# Patient Record
Sex: Male | Born: 1935 | Race: White | Hispanic: No | Marital: Married | Smoking: Former smoker
Health system: Southern US, Community
[De-identification: ages and names within clinical notes are randomized; demographics above are authoritative.]

## PROBLEM LIST (undated history)

## (undated) DIAGNOSIS — I2510 Atherosclerotic heart disease of native coronary artery without angina pectoris: Secondary | ICD-10-CM

## (undated) DIAGNOSIS — I4892 Unspecified atrial flutter: Secondary | ICD-10-CM

## (undated) DIAGNOSIS — F1010 Alcohol abuse, uncomplicated: Secondary | ICD-10-CM

## (undated) DIAGNOSIS — E785 Hyperlipidemia, unspecified: Secondary | ICD-10-CM

## (undated) DIAGNOSIS — W3400XA Accidental discharge from unspecified firearms or gun, initial encounter: Secondary | ICD-10-CM

## (undated) DIAGNOSIS — I442 Atrioventricular block, complete: Secondary | ICD-10-CM

## (undated) DIAGNOSIS — I10 Essential (primary) hypertension: Secondary | ICD-10-CM

## (undated) DIAGNOSIS — Z950 Presence of cardiac pacemaker: Secondary | ICD-10-CM

## (undated) DIAGNOSIS — C801 Malignant (primary) neoplasm, unspecified: Secondary | ICD-10-CM

## (undated) HISTORY — DX: Atrioventricular block, complete: I44.2

## (undated) HISTORY — DX: Essential (primary) hypertension: I10

## (undated) HISTORY — DX: Hyperlipidemia, unspecified: E78.5

## (undated) HISTORY — DX: Unspecified atrial flutter: I48.92

## (undated) HISTORY — DX: Atherosclerotic heart disease of native coronary artery without angina pectoris: I25.10

## (undated) HISTORY — DX: Accidental discharge from unspecified firearms or gun, initial encounter: W34.00XA

## (undated) HISTORY — PX: INSERT / REPLACE / REMOVE PACEMAKER: SUR710

## (undated) HISTORY — DX: Presence of cardiac pacemaker: Z95.0

## (undated) HISTORY — DX: Alcohol abuse, uncomplicated: F10.10

---

## 2002-04-04 ENCOUNTER — Emergency Department (HOSPITAL_COMMUNITY): Admission: EM | Admit: 2002-04-04 | Discharge: 2002-04-05 | Payer: Self-pay | Admitting: Emergency Medicine

## 2002-04-05 ENCOUNTER — Encounter: Payer: Self-pay | Admitting: Emergency Medicine

## 2002-04-11 ENCOUNTER — Encounter: Payer: Self-pay | Admitting: Internal Medicine

## 2002-04-11 ENCOUNTER — Ambulatory Visit (HOSPITAL_COMMUNITY): Admission: RE | Admit: 2002-04-11 | Discharge: 2002-04-11 | Payer: Self-pay | Admitting: Internal Medicine

## 2002-04-14 ENCOUNTER — Ambulatory Visit (HOSPITAL_COMMUNITY): Admission: RE | Admit: 2002-04-14 | Discharge: 2002-04-14 | Payer: Self-pay | Admitting: Internal Medicine

## 2002-04-14 ENCOUNTER — Encounter: Payer: Self-pay | Admitting: Internal Medicine

## 2002-04-19 ENCOUNTER — Other Ambulatory Visit: Admission: RE | Admit: 2002-04-19 | Discharge: 2002-04-19 | Payer: Self-pay | Admitting: Internal Medicine

## 2002-04-19 ENCOUNTER — Other Ambulatory Visit: Admission: RE | Admit: 2002-04-19 | Discharge: 2002-04-19 | Payer: Self-pay | Admitting: Otolaryngology

## 2002-04-28 ENCOUNTER — Ambulatory Visit (HOSPITAL_BASED_OUTPATIENT_CLINIC_OR_DEPARTMENT_OTHER): Admission: RE | Admit: 2002-04-28 | Discharge: 2002-04-29 | Payer: Self-pay | Admitting: Otolaryngology

## 2002-04-28 ENCOUNTER — Encounter (INDEPENDENT_AMBULATORY_CARE_PROVIDER_SITE_OTHER): Payer: Self-pay | Admitting: Specialist

## 2002-05-23 ENCOUNTER — Other Ambulatory Visit: Admission: RE | Admit: 2002-05-23 | Discharge: 2002-05-23 | Payer: Self-pay | Admitting: Oncology

## 2002-05-23 ENCOUNTER — Encounter (INDEPENDENT_AMBULATORY_CARE_PROVIDER_SITE_OTHER): Payer: Self-pay

## 2002-07-06 ENCOUNTER — Encounter: Payer: Self-pay | Admitting: Oncology

## 2002-07-06 ENCOUNTER — Ambulatory Visit (HOSPITAL_COMMUNITY): Admission: RE | Admit: 2002-07-06 | Discharge: 2002-07-06 | Payer: Self-pay | Admitting: Oncology

## 2002-07-13 ENCOUNTER — Encounter (HOSPITAL_COMMUNITY): Admission: RE | Admit: 2002-07-13 | Discharge: 2002-10-11 | Payer: Self-pay | Admitting: Oncology

## 2002-09-11 ENCOUNTER — Encounter: Payer: Self-pay | Admitting: Oncology

## 2002-09-11 ENCOUNTER — Ambulatory Visit (HOSPITAL_COMMUNITY): Admission: RE | Admit: 2002-09-11 | Discharge: 2002-09-11 | Payer: Self-pay | Admitting: Oncology

## 2002-10-13 ENCOUNTER — Encounter (HOSPITAL_COMMUNITY): Admission: RE | Admit: 2002-10-13 | Discharge: 2002-10-13 | Payer: Self-pay | Admitting: Oncology

## 2002-12-28 ENCOUNTER — Encounter: Payer: Self-pay | Admitting: Oncology

## 2002-12-28 ENCOUNTER — Ambulatory Visit (HOSPITAL_COMMUNITY): Admission: RE | Admit: 2002-12-28 | Discharge: 2002-12-28 | Payer: Self-pay | Admitting: Oncology

## 2004-04-22 ENCOUNTER — Inpatient Hospital Stay (HOSPITAL_COMMUNITY): Admission: AD | Admit: 2004-04-22 | Discharge: 2004-04-23 | Payer: Self-pay | Admitting: Internal Medicine

## 2004-05-09 ENCOUNTER — Ambulatory Visit (HOSPITAL_COMMUNITY): Admission: RE | Admit: 2004-05-09 | Discharge: 2004-05-09 | Payer: Self-pay | Admitting: Internal Medicine

## 2004-05-09 HISTORY — PX: COLONOSCOPY: SHX174

## 2004-09-02 ENCOUNTER — Inpatient Hospital Stay (HOSPITAL_COMMUNITY): Admission: AD | Admit: 2004-09-02 | Discharge: 2004-09-04 | Payer: Self-pay | Admitting: Internal Medicine

## 2004-11-24 ENCOUNTER — Ambulatory Visit: Payer: Self-pay | Admitting: Oncology

## 2005-06-11 ENCOUNTER — Ambulatory Visit: Payer: Self-pay | Admitting: Oncology

## 2005-08-06 ENCOUNTER — Ambulatory Visit: Payer: Self-pay | Admitting: Oncology

## 2005-08-26 ENCOUNTER — Inpatient Hospital Stay (HOSPITAL_COMMUNITY): Admission: AD | Admit: 2005-08-26 | Discharge: 2005-08-28 | Payer: Self-pay | Admitting: Internal Medicine

## 2005-09-11 ENCOUNTER — Inpatient Hospital Stay (HOSPITAL_COMMUNITY): Admission: RE | Admit: 2005-09-11 | Discharge: 2005-09-13 | Payer: Self-pay | Admitting: Psychiatry

## 2005-09-11 ENCOUNTER — Ambulatory Visit: Payer: Self-pay | Admitting: Psychiatry

## 2005-09-11 ENCOUNTER — Encounter: Payer: Self-pay | Admitting: Psychiatry

## 2005-09-22 ENCOUNTER — Ambulatory Visit (HOSPITAL_COMMUNITY): Admission: RE | Admit: 2005-09-22 | Discharge: 2005-09-22 | Payer: Self-pay | Admitting: Internal Medicine

## 2005-12-11 ENCOUNTER — Ambulatory Visit: Payer: Self-pay | Admitting: Oncology

## 2006-06-07 ENCOUNTER — Ambulatory Visit: Payer: Self-pay | Admitting: Oncology

## 2006-06-09 LAB — CBC WITH DIFFERENTIAL/PLATELET
BASO%: 0.4 % (ref 0.0–2.0)
EOS%: 3.4 % (ref 0.0–7.0)
HGB: 15.1 g/dL (ref 13.0–17.1)
MCH: 35.4 pg — ABNORMAL HIGH (ref 28.0–33.4)
MCHC: 35 g/dL (ref 32.0–35.9)
MCV: 101.3 fL — ABNORMAL HIGH (ref 81.6–98.0)
MONO%: 10 % (ref 0.0–13.0)
RBC: 4.28 10*6/uL (ref 4.20–5.71)
RDW: 14.7 % — ABNORMAL HIGH (ref 11.2–14.6)
lymph#: 1.3 10*3/uL (ref 0.9–3.3)

## 2006-07-21 ENCOUNTER — Inpatient Hospital Stay (HOSPITAL_COMMUNITY): Admission: AD | Admit: 2006-07-21 | Discharge: 2006-07-23 | Payer: Self-pay | Admitting: Cardiovascular Disease

## 2006-07-21 ENCOUNTER — Ambulatory Visit: Payer: Self-pay | Admitting: Cardiology

## 2006-07-21 ENCOUNTER — Encounter: Payer: Self-pay | Admitting: Cardiology

## 2006-08-11 ENCOUNTER — Ambulatory Visit: Payer: Self-pay

## 2006-08-30 ENCOUNTER — Ambulatory Visit: Payer: Self-pay

## 2006-09-03 ENCOUNTER — Ambulatory Visit: Payer: Self-pay | Admitting: Cardiology

## 2006-09-13 ENCOUNTER — Encounter: Payer: Self-pay | Admitting: Cardiology

## 2006-09-13 ENCOUNTER — Ambulatory Visit: Payer: Self-pay

## 2006-10-01 ENCOUNTER — Ambulatory Visit: Payer: Self-pay | Admitting: Cardiology

## 2006-11-17 ENCOUNTER — Ambulatory Visit: Payer: Self-pay | Admitting: Cardiology

## 2006-11-24 ENCOUNTER — Inpatient Hospital Stay (HOSPITAL_COMMUNITY): Admission: AD | Admit: 2006-11-24 | Discharge: 2006-11-26 | Payer: Self-pay | Admitting: Internal Medicine

## 2006-12-06 ENCOUNTER — Ambulatory Visit: Payer: Self-pay | Admitting: Oncology

## 2006-12-07 ENCOUNTER — Ambulatory Visit (HOSPITAL_COMMUNITY): Payer: Self-pay | Admitting: Psychology

## 2006-12-16 LAB — RETICULOCYTES
IRF: 0.52 — ABNORMAL HIGH (ref 0.070–0.380)
RETIC #: 112.1 10*3/uL — ABNORMAL HIGH (ref 31.8–103.9)
Retic %: 2.9 % — ABNORMAL HIGH (ref 0.7–2.3)

## 2006-12-16 LAB — CBC WITH DIFFERENTIAL/PLATELET
HCT: 39.4 % (ref 38.7–49.9)
MCHC: 34 g/dL (ref 32.0–35.9)
MONO#: 0.5 10*3/uL (ref 0.1–0.9)
NEUT%: 65.8 % (ref 40.0–75.0)
RBC: 3.86 10*6/uL — ABNORMAL LOW (ref 4.20–5.71)
RDW: 14.9 % — ABNORMAL HIGH (ref 11.2–14.6)
WBC: 4.8 10*3/uL (ref 4.0–10.0)
lymph#: 1 10*3/uL (ref 0.9–3.3)

## 2007-01-18 ENCOUNTER — Ambulatory Visit (HOSPITAL_COMMUNITY): Payer: Self-pay | Admitting: Psychology

## 2007-01-30 ENCOUNTER — Ambulatory Visit: Payer: Self-pay | Admitting: *Deleted

## 2007-01-30 ENCOUNTER — Inpatient Hospital Stay (HOSPITAL_COMMUNITY): Admission: AD | Admit: 2007-01-30 | Discharge: 2007-01-31 | Payer: Self-pay | Admitting: *Deleted

## 2007-03-15 ENCOUNTER — Ambulatory Visit: Payer: Self-pay | Admitting: Oncology

## 2007-03-17 LAB — CBC WITH DIFFERENTIAL/PLATELET
Basophils Absolute: 0.1 10*3/uL (ref 0.0–0.1)
Eosinophils Absolute: 0.3 10*3/uL (ref 0.0–0.5)
HGB: 14.3 g/dL (ref 13.0–17.1)
MCV: 100.9 fL — ABNORMAL HIGH (ref 81.6–98.0)
MONO%: 8.7 % (ref 0.0–13.0)
NEUT#: 3 10*3/uL (ref 1.5–6.5)
RBC: 4.02 10*6/uL — ABNORMAL LOW (ref 4.20–5.71)
RDW: 14.8 % — ABNORMAL HIGH (ref 11.2–14.6)
WBC: 5.1 10*3/uL (ref 4.0–10.0)
lymph#: 1.2 10*3/uL (ref 0.9–3.3)

## 2007-03-18 LAB — COMPREHENSIVE METABOLIC PANEL
AST: 32 U/L (ref 0–37)
Albumin: 4.2 g/dL (ref 3.5–5.2)
Alkaline Phosphatase: 63 U/L (ref 39–117)
BUN: 11 mg/dL (ref 6–23)
Calcium: 8.7 mg/dL (ref 8.4–10.5)
Chloride: 104 mEq/L (ref 96–112)
Glucose, Bld: 111 mg/dL — ABNORMAL HIGH (ref 70–99)
Potassium: 4.1 mEq/L (ref 3.5–5.3)
Sodium: 138 mEq/L (ref 135–145)
Total Protein: 6.7 g/dL (ref 6.0–8.3)

## 2007-03-18 LAB — DIRECT ANTIGLOBULIN TEST (NOT AT ARMC)
DAT (Complement): NEGATIVE
DAT IgG: NEGATIVE

## 2007-03-22 ENCOUNTER — Ambulatory Visit (HOSPITAL_COMMUNITY): Payer: Self-pay | Admitting: Psychology

## 2007-05-16 IMAGING — CR DG CHEST 2V
2 series · 2 of 2 positions shown · non-contrast
Comparison: none

CLINICAL DATA: Atrial flutter.  Alcohol abuse. 
 2-VIEW CHEST:

[view not recorded (1 of 2)]
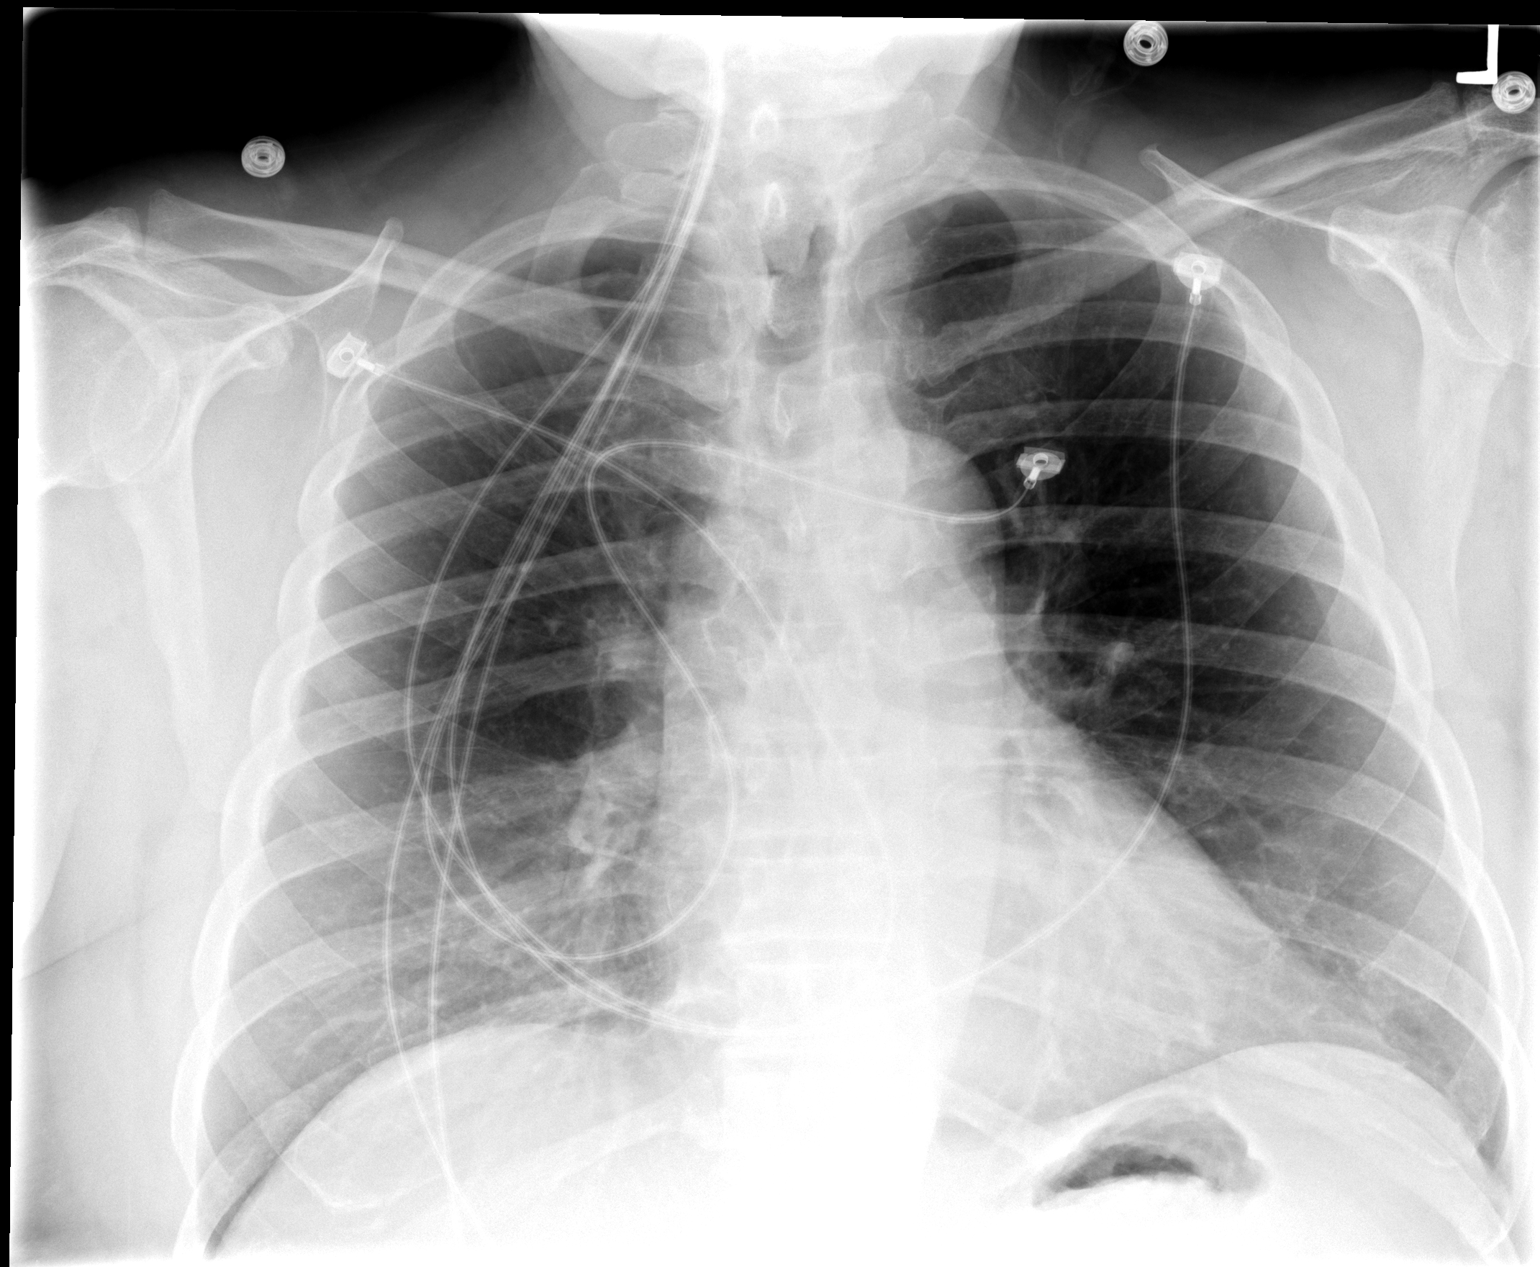

[view not recorded (2 of 2)]
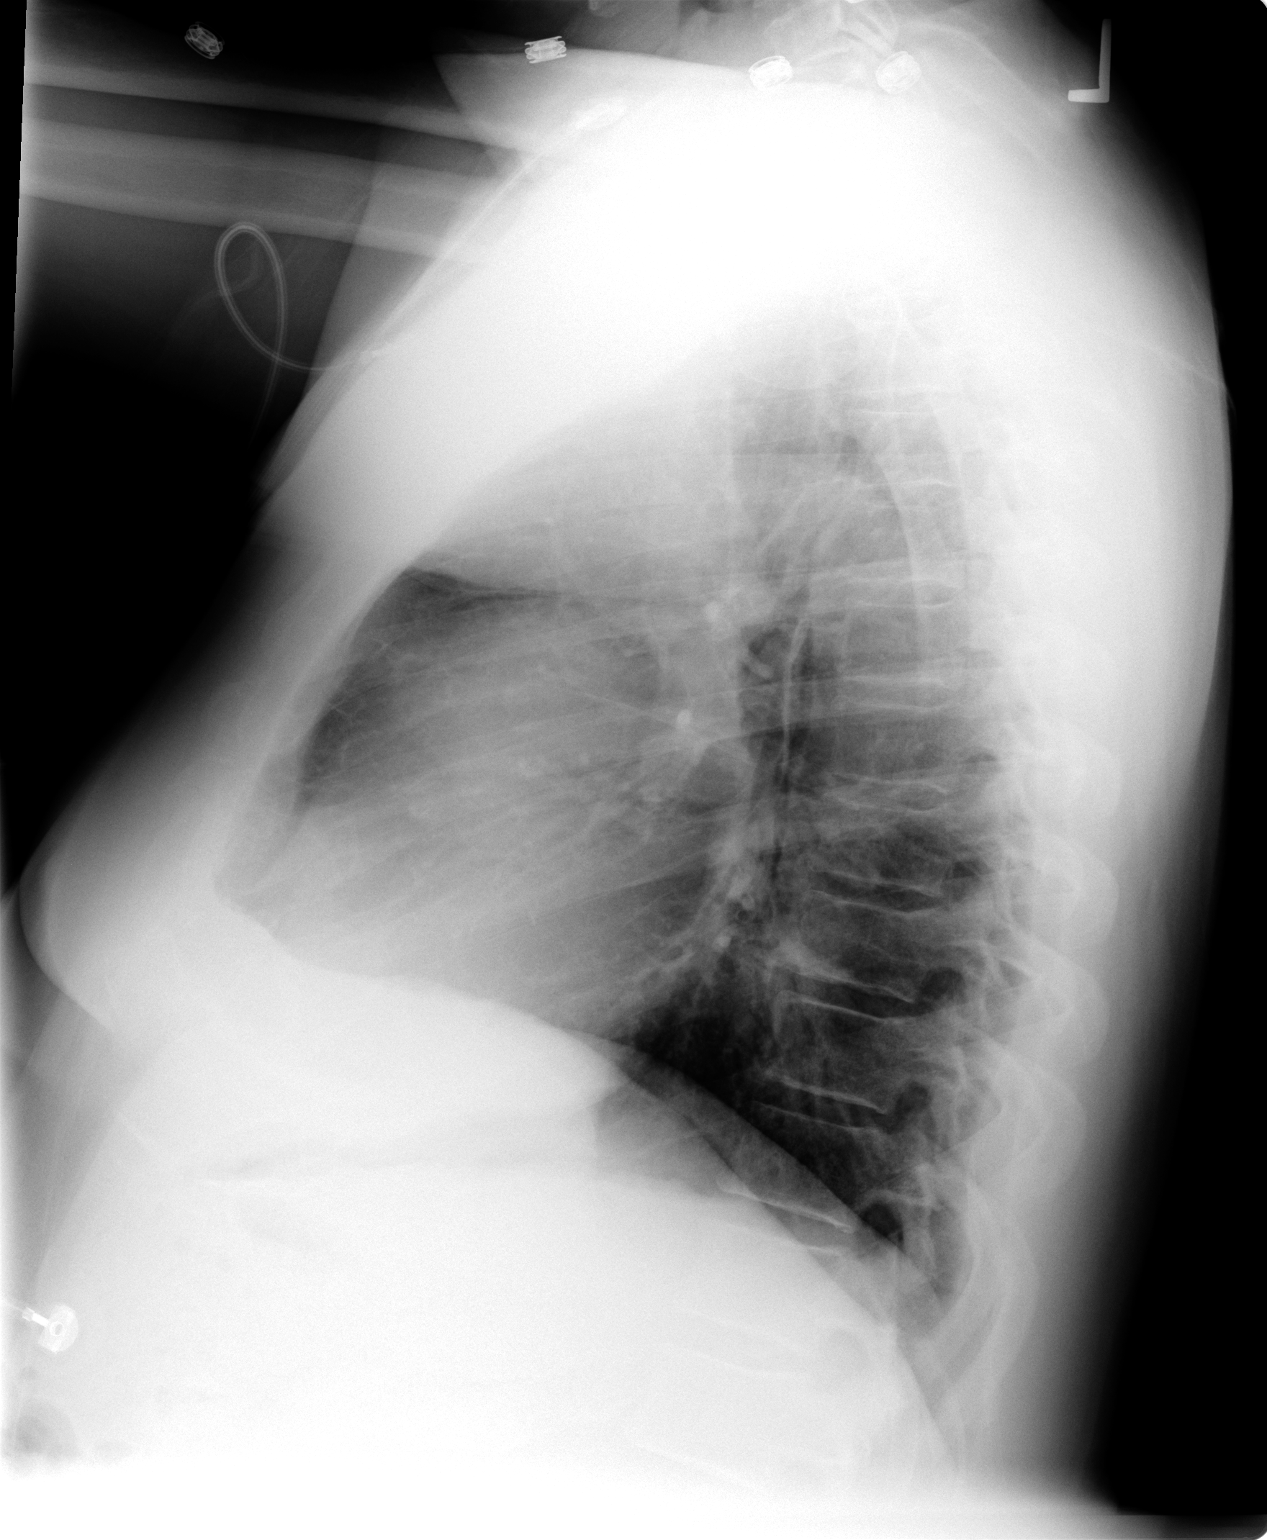

[2 of 2 positions shown; findings below may reference images not displayed]

FINDINGS: PA and lateral views of the chest are made and are compared to a previous CT scan of the chest dated 04/22/04 and show the heart to be within the limits of normal.  The aorta is minimally elongated but not dilated or calcified.  There is no edema, pleural effusion, or pneumothorax.  Bronchovascular markings are mildly accentuated in a generalized fashion.  Bony thorax appears normal.
IMPRESSION: No evidence of active disease within the chest.  Mild diffuse peribronchial thickening.  Mild aortic elongation without dilatation or calcification.

## 2007-06-13 ENCOUNTER — Ambulatory Visit: Payer: Self-pay | Admitting: Oncology

## 2007-07-07 LAB — CBC WITH DIFFERENTIAL/PLATELET
BASO%: 0.4 % (ref 0.0–2.0)
Eosinophils Absolute: 0.2 10*3/uL (ref 0.0–0.5)
LYMPH%: 23.1 % (ref 14.0–48.0)
MCHC: 35.7 g/dL (ref 32.0–35.9)
MONO#: 0.5 10*3/uL (ref 0.1–0.9)
NEUT#: 2.7 10*3/uL (ref 1.5–6.5)
Platelets: 157 10*3/uL (ref 145–400)
RBC: 3.68 10*6/uL — ABNORMAL LOW (ref 4.20–5.71)
RDW: 13.8 % (ref 11.2–14.6)
WBC: 4.4 10*3/uL (ref 4.0–10.0)
lymph#: 1 10*3/uL (ref 0.9–3.3)

## 2007-07-08 LAB — COMPREHENSIVE METABOLIC PANEL
ALT: 16 U/L (ref 0–53)
Albumin: 4.3 g/dL (ref 3.5–5.2)
CO2: 27 mEq/L (ref 19–32)
Potassium: 4 mEq/L (ref 3.5–5.3)
Sodium: 143 mEq/L (ref 135–145)
Total Bilirubin: 0.6 mg/dL (ref 0.3–1.2)
Total Protein: 6.6 g/dL (ref 6.0–8.3)

## 2007-07-08 LAB — LACTATE DEHYDROGENASE: LDH: 144 U/L (ref 94–250)

## 2007-07-19 ENCOUNTER — Ambulatory Visit: Payer: Self-pay | Admitting: Cardiology

## 2007-09-29 ENCOUNTER — Ambulatory Visit: Payer: Self-pay | Admitting: Cardiology

## 2007-12-23 ENCOUNTER — Ambulatory Visit (HOSPITAL_COMMUNITY): Admission: RE | Admit: 2007-12-23 | Discharge: 2007-12-23 | Payer: Self-pay | Admitting: Internal Medicine

## 2007-12-28 ENCOUNTER — Ambulatory Visit: Payer: Self-pay | Admitting: Cardiology

## 2008-01-04 ENCOUNTER — Ambulatory Visit: Payer: Self-pay | Admitting: Oncology

## 2008-01-06 LAB — CBC WITH DIFFERENTIAL/PLATELET
Basophils Absolute: 0 10*3/uL (ref 0.0–0.1)
EOS%: 1.4 % (ref 0.0–7.0)
Eosinophils Absolute: 0.1 10*3/uL (ref 0.0–0.5)
HGB: 13.1 g/dL (ref 13.0–17.1)
LYMPH%: 16.5 % (ref 14.0–48.0)
MCH: 34.9 pg — ABNORMAL HIGH (ref 28.0–33.4)
MCV: 100.1 fL — ABNORMAL HIGH (ref 81.6–98.0)
MONO%: 12.8 % (ref 0.0–13.0)
NEUT#: 3.5 10*3/uL (ref 1.5–6.5)
Platelets: 211 10*3/uL (ref 145–400)
RBC: 3.76 10*6/uL — ABNORMAL LOW (ref 4.20–5.71)

## 2008-01-30 ENCOUNTER — Ambulatory Visit (HOSPITAL_COMMUNITY): Admission: RE | Admit: 2008-01-30 | Discharge: 2008-01-30 | Payer: Self-pay | Admitting: Internal Medicine

## 2008-03-28 ENCOUNTER — Ambulatory Visit: Payer: Self-pay | Admitting: Cardiology

## 2008-06-20 ENCOUNTER — Ambulatory Visit: Payer: Self-pay | Admitting: Cardiology

## 2008-07-09 ENCOUNTER — Ambulatory Visit: Payer: Self-pay | Admitting: Oncology

## 2008-07-12 LAB — CBC WITH DIFFERENTIAL/PLATELET
EOS%: 2.1 % (ref 0.0–7.0)
Eosinophils Absolute: 0.2 10*3/uL (ref 0.0–0.5)
LYMPH%: 12.8 % — ABNORMAL LOW (ref 14.0–48.0)
MONO%: 9.2 % (ref 0.0–13.0)
NEUT#: 6.6 10*3/uL — ABNORMAL HIGH (ref 1.5–6.5)
NEUT%: 75.5 % — ABNORMAL HIGH (ref 40.0–75.0)
RBC: 4.18 10*6/uL — ABNORMAL LOW (ref 4.20–5.71)
WBC: 8.7 10*3/uL (ref 4.0–10.0)
lymph#: 1.1 10*3/uL (ref 0.9–3.3)

## 2008-09-12 ENCOUNTER — Ambulatory Visit: Payer: Self-pay | Admitting: Cardiology

## 2008-12-19 ENCOUNTER — Ambulatory Visit: Payer: Self-pay | Admitting: Cardiology

## 2009-04-01 ENCOUNTER — Ambulatory Visit: Payer: Self-pay | Admitting: Cardiology

## 2009-04-08 ENCOUNTER — Encounter (INDEPENDENT_AMBULATORY_CARE_PROVIDER_SITE_OTHER): Payer: Self-pay | Admitting: *Deleted

## 2009-06-23 DIAGNOSIS — I442 Atrioventricular block, complete: Secondary | ICD-10-CM

## 2009-06-23 DIAGNOSIS — E785 Hyperlipidemia, unspecified: Secondary | ICD-10-CM

## 2009-06-23 DIAGNOSIS — I2510 Atherosclerotic heart disease of native coronary artery without angina pectoris: Secondary | ICD-10-CM

## 2009-06-23 DIAGNOSIS — I4892 Unspecified atrial flutter: Secondary | ICD-10-CM

## 2009-06-23 DIAGNOSIS — I10 Essential (primary) hypertension: Secondary | ICD-10-CM

## 2009-06-25 ENCOUNTER — Ambulatory Visit: Payer: Self-pay | Admitting: Cardiology

## 2009-07-11 ENCOUNTER — Ambulatory Visit: Payer: Self-pay | Admitting: Oncology

## 2009-07-15 ENCOUNTER — Encounter: Payer: Self-pay | Admitting: Cardiology

## 2009-07-15 LAB — CBC WITH DIFFERENTIAL/PLATELET
BASO%: 0.3 % (ref 0.0–2.0)
EOS%: 3.4 % (ref 0.0–7.0)
HCT: 38.2 % — ABNORMAL LOW (ref 38.4–49.9)
HGB: 13.5 g/dL (ref 13.0–17.1)
MCH: 34.5 pg — ABNORMAL HIGH (ref 27.2–33.4)
MCHC: 35.3 g/dL (ref 32.0–36.0)
MCV: 97.7 fL (ref 79.3–98.0)
MONO%: 7.9 % (ref 0.0–14.0)
RDW: 13.7 % (ref 11.0–14.6)
WBC: 6.1 10*3/uL (ref 4.0–10.3)

## 2009-10-02 ENCOUNTER — Ambulatory Visit: Payer: Self-pay | Admitting: Internal Medicine

## 2009-10-10 ENCOUNTER — Encounter: Payer: Self-pay | Admitting: Cardiology

## 2009-11-01 ENCOUNTER — Ambulatory Visit: Payer: Self-pay | Admitting: Oncology

## 2009-11-05 LAB — CBC WITH DIFFERENTIAL/PLATELET
BASO%: 0.4 % (ref 0.0–2.0)
EOS%: 2.7 % (ref 0.0–7.0)
HGB: 13.3 g/dL (ref 13.0–17.1)
MCH: 35.6 pg — ABNORMAL HIGH (ref 27.2–33.4)
MCV: 102.3 fL — ABNORMAL HIGH (ref 79.3–98.0)
RBC: 3.74 10*6/uL — ABNORMAL LOW (ref 4.20–5.82)
WBC: 5.6 10*3/uL (ref 4.0–10.3)
lymph#: 1 10*3/uL (ref 0.9–3.3)

## 2010-01-01 ENCOUNTER — Ambulatory Visit: Payer: Self-pay | Admitting: Cardiology

## 2010-01-29 ENCOUNTER — Encounter: Payer: Self-pay | Admitting: Cardiology

## 2010-04-02 ENCOUNTER — Ambulatory Visit: Payer: Self-pay | Admitting: Cardiology

## 2010-04-09 ENCOUNTER — Encounter: Payer: Self-pay | Admitting: Cardiology

## 2010-04-09 ENCOUNTER — Telehealth: Payer: Self-pay | Admitting: Cardiology

## 2010-04-21 ENCOUNTER — Ambulatory Visit: Payer: Self-pay | Admitting: Cardiology

## 2010-04-21 DIAGNOSIS — R29818 Other symptoms and signs involving the nervous system: Secondary | ICD-10-CM | POA: Insufficient documentation

## 2010-04-21 DIAGNOSIS — R4789 Other speech disturbances: Secondary | ICD-10-CM | POA: Insufficient documentation

## 2010-04-21 DIAGNOSIS — G459 Transient cerebral ischemic attack, unspecified: Secondary | ICD-10-CM | POA: Insufficient documentation

## 2010-05-29 ENCOUNTER — Encounter: Admission: RE | Admit: 2010-05-29 | Discharge: 2010-05-29 | Payer: Self-pay | Admitting: Neurology

## 2010-07-04 ENCOUNTER — Ambulatory Visit: Payer: Self-pay | Admitting: Oncology

## 2010-07-08 ENCOUNTER — Encounter: Payer: Self-pay | Admitting: Cardiology

## 2010-07-10 ENCOUNTER — Ambulatory Visit: Payer: Self-pay | Admitting: Cardiology

## 2010-08-28 ENCOUNTER — Ambulatory Visit: Payer: Self-pay | Admitting: Oncology

## 2010-09-01 ENCOUNTER — Encounter: Payer: Self-pay | Admitting: Cardiology

## 2010-09-01 LAB — CBC WITH DIFFERENTIAL/PLATELET
BASO%: 0 % (ref 0.0–2.0)
Basophils Absolute: 0 10e3/uL (ref 0.0–0.1)
EOS%: 1.5 % (ref 0.0–7.0)
Eosinophils Absolute: 0.1 10e3/uL (ref 0.0–0.5)
HCT: 37.3 % — ABNORMAL LOW (ref 38.4–49.9)
HGB: 12.8 g/dL — ABNORMAL LOW (ref 13.0–17.1)
LYMPH%: 13.5 % — ABNORMAL LOW (ref 14.0–49.0)
MCH: 35.1 pg — ABNORMAL HIGH (ref 27.2–33.4)
MCHC: 34.2 g/dL (ref 32.0–36.0)
MCV: 102.4 fL — ABNORMAL HIGH (ref 79.3–98.0)
MONO#: 0.5 10e3/uL (ref 0.1–0.9)
MONO%: 7.2 % (ref 0.0–14.0)
NEUT#: 5.3 10e3/uL (ref 1.5–6.5)
NEUT%: 77.8 % — ABNORMAL HIGH (ref 39.0–75.0)
Platelets: 139 10e3/uL — ABNORMAL LOW (ref 140–400)
RBC: 3.64 10e6/uL — ABNORMAL LOW (ref 4.20–5.82)
RDW: 14.7 % — ABNORMAL HIGH (ref 11.0–14.6)
WBC: 6.8 10e3/uL (ref 4.0–10.3)
lymph#: 0.9 10e3/uL (ref 0.9–3.3)

## 2010-10-13 ENCOUNTER — Ambulatory Visit: Payer: Self-pay | Admitting: Internal Medicine

## 2010-10-13 ENCOUNTER — Ambulatory Visit: Payer: Self-pay | Admitting: Cardiology

## 2010-10-15 ENCOUNTER — Telehealth: Payer: Self-pay | Admitting: Internal Medicine

## 2010-10-16 ENCOUNTER — Inpatient Hospital Stay (HOSPITAL_COMMUNITY): Admission: RE | Admit: 2010-10-16 | Discharge: 2010-10-17 | Payer: Self-pay | Admitting: Internal Medicine

## 2010-10-16 ENCOUNTER — Ambulatory Visit: Payer: Self-pay | Admitting: Internal Medicine

## 2010-10-17 ENCOUNTER — Encounter (INDEPENDENT_AMBULATORY_CARE_PROVIDER_SITE_OTHER): Payer: Self-pay | Admitting: *Deleted

## 2010-10-21 ENCOUNTER — Ambulatory Visit: Payer: Self-pay | Admitting: Internal Medicine

## 2010-10-23 ENCOUNTER — Encounter: Payer: Self-pay | Admitting: Internal Medicine

## 2010-10-30 ENCOUNTER — Ambulatory Visit: Payer: Self-pay

## 2010-10-30 ENCOUNTER — Encounter: Payer: Self-pay | Admitting: Internal Medicine

## 2011-01-16 ENCOUNTER — Encounter: Payer: Self-pay | Admitting: Internal Medicine

## 2011-01-16 ENCOUNTER — Ambulatory Visit
Admission: RE | Admit: 2011-01-16 | Discharge: 2011-01-16 | Payer: Self-pay | Source: Home / Self Care | Attending: Internal Medicine | Admitting: Internal Medicine

## 2011-01-20 NOTE — Letter (Signed)
Summary: [facility]   Imported By: Marylou Mccoy 10/06/2010 07:49:41  _____________________________________________________________________  External Attachment:    Type:   Image     Comment:   External Document

## 2011-01-20 NOTE — Miscellaneous (Signed)
Summary: Device change out  Clinical Lists Changes  Observations: Added new observation of PPMLEADSTAT3: active (10/23/2010 16:30) Added new observation of PPMLEADSER3: [id number] (10/23/2010 16:30) Added new observation of PPMLEADMOD3: 5076  (10/23/2010 16:30) Added new observation of PPMLEADDOI3: 10/16/2010  (10/23/2010 16:30) Added new observation of PPMLEADLOC3: RV  (10/23/2010 16:30) Added new observation of PPMLEADSTAT2: capped  (10/23/2010 16:30) Added new observation of PPM DOI: 10/16/2010  (10/23/2010 16:30) Added new observation of PPM SERL#: [id number]  (10/23/2010 16:30) Added new observation of PPM MODL#: ADDRL1  (10/23/2010 40:34) Added new observation of MAGNET RTE: BOL 85 ERI 65  (10/23/2010 16:30) Added new observation of PPMEXPLCOMM: 10/16/10 ADDRO1/[id number] H EXPLANTED  (10/23/2010 16:30) Added new observation of PPM IMP MD: Hillis Range, MD  (10/23/2010 16:30) Added new observation of PACEMAKER MD: Hillis Range, MD  (10/23/2010 16:30)      PPM Specifications Following MD:  Hillis Range, MD     PPM Vendor:  Medtronic     PPM Model Number:  ADDRL1     PPM Serial Number:  [id number] PPM DOI:  10/16/2010     PPM Implanting MD:  Hillis Range, MD  Lead 1    Location: RA     DOI: 07/22/2006     Model #: 7564     Serial #: [id number]     Status: active Lead 2    Location: RV     DOI: 07/22/2006     Model #: 6606     Serial #: [id number]     Status: capped Lead 3    Location: RV     DOI: 10/16/2010     Model #: 3557     Serial #: [id number]     Status: active  Magnet Response Rate:  BOL 85 ERI 65  Indications:  Completle heart block with syncope  Explantation Comments:  10/16/10 ADDRO1/PWB205481 H EXPLANTED  PPM Follow Up Pacer Dependent:  Yes      Episodes Coumadin:  No  Parameters Mode:  DDDR     Lower Rate Limit:  60     Upper Rate Limit:  130 Paced AV Delay:  200     Sensed AV Delay:  200

## 2011-01-20 NOTE — Progress Notes (Signed)
Summary: abnormal pacer transmission  Phone Note Outgoing Call Call back at [facility] Phone [phone]   Call placed by: Gypsy Balsam RN BSN,  April 09, 2010 10:47 AM Summary of Call: Dr Juanda Chance reviewed [facility] tranmsission.  Ventricular lead impedence and autocapture threshold with uptick in last month.  Appt with Dr Juanda Chance 04-21-10 to evaluate in office.  Pt to send another [facility] tranmsission today. Gypsy Balsam RN BSN  April 09, 2010 10:47 AM

## 2011-01-20 NOTE — Cardiovascular Report (Signed)
Summary: Office Visit   Office Visit   Imported By: Roderic Ovens 05/09/2010 16:28:01  _____________________________________________________________________  External Attachment:    Type:   Image     Comment:   External Document

## 2011-01-20 NOTE — Procedures (Signed)
Summary: wound check.mdt.amber   Current Medications (verified): 1)  Simvastatin 40 Mg Tabs (Simvastatin) .... Take One Tablet By Mouth Daily At Bedtime 2)  Lisinopril 10 Mg Tabs (Lisinopril) .... Take One Tablet By Mouth Daily 3)  Aspirin 81 Mg Tbec (Aspirin) .... Take One Tablet By Mouth Daily 4)  Omeprazole 20 Mg Cpdr (Omeprazole) .... 3 X A Week 5)  Hydrocordone .... Shoulder Pain 6)  Multivitamins   Tabs (Multiple Vitamin) .[name].. 1 Tab By Mouth Once Daily 7)  Clonazepam 1 Mg Tabs (Clonazepam) .... As Directed  Allergies (verified): No Known Drug Allergies  PPM Specifications Following MD:  Hillis Range, MD     PPM Vendor:  Medtronic     PPM Model Number:  ADDRL1     PPM Serial Number:  [id number] H PPM DOI:  10/16/2010     PPM Implanting MD:  Hillis Range, MD  Lead 1    Location: RA     DOI: 07/22/2006     Model #: 4098     Serial #: [id number]     Status: active Lead 2    Location: RV     DOI: 07/22/2006     Model #: 6213     Serial #: [id number]     Status: capped Lead 3    Location: RV     DOI: 10/16/2010     Model #: 2952     Serial #: [id number]     Status: active  Magnet Response Rate:  BOL 85 ERI 65  Indications:  Completle heart block with syncope  Explantation Comments:  10/16/10 ADDRO1/PWB205481 H EXPLANTED  PPM Follow Up Remote Check?  No Battery Voltage:  2.79 V     Battery Est. Longevity:  10 years     Pacer Dependent:  Yes       PPM Device Measurements Atrium  Amplitude: 4.0 mV, Impedance: 474 ohms, Threshold: 0.5 V at 0.4 msec Right Ventricle  Impedance: 652 ohms, Threshold: 0.75 V at 0.4 msec  Episodes MS Episodes:  1     Percent Mode Switch:  <0.1%     Coumadin:  No Ventricular High Rate:  0     Atrial Pacing:  72.7%     Ventricular Pacing:  100%  Parameters Mode:  DDDR     Lower Rate Limit:  60     Upper Rate Limit:  130 Paced AV Delay:  200     Sensed AV Delay:  200 Next Cardiology Appt Due:  01/16/2011 Tech Comments:  Steri strips removed, no redness  or edema noted.  No parameter changes.  Device function normal.  1 mode switch .  ROV 01/16/11 with Dr. Johney Frame. Altha Harm, LPN  October 30, 2010 3:53 PM

## 2011-01-20 NOTE — Cardiovascular Report (Signed)
Summary: Office Visit   Office Visit   Imported By: Roderic Ovens 11/10/2010 15:46:41  _____________________________________________________________________  External Attachment:    Type:   Image     Comment:   External Document

## 2011-01-20 NOTE — Cardiovascular Report (Signed)
Summary: Office Visit   Office Visit   Imported By: Roderic Ovens 10/14/2010 10:47:28  _____________________________________________________________________  External Attachment:    Type:   Image     Comment:   External Document

## 2011-01-20 NOTE — Progress Notes (Signed)
Summary: pt rtn call to set up procedure   Phone Note Call from Patient Call back at Home Phone [phone]   Caller: Spouse Reason for Call: Talk to Nurse, Talk to Doctor Summary of Call: pt spouse rtn your call to set up procedure for tomorrow Initial call taken by: Omer Jack,  October 15, 2010 4:07 PM  Follow-up for Phone Call        Pt will be there tomorrow at 8:00 am for RV lead revision Pt moved per Dr Johney Frame Dennis Bast, RN, BSN  October 15, 2010 4:36 PM

## 2011-01-20 NOTE — Letter (Signed)
Summary: Remote Device Check  [facility], Main Office  [address]   [place], [place] [address]   Phone: [phone]  Fax: [phone]     January 29, 2010 MRN: [id number]   ION GONNELLA [address] [place], [place]  [address]   Dear Mr. Callanan,   Your remote transmission was recieved and reviewed by your physician.  All diagnostics were within normal limits for you.  __X___Your next transmission is scheduled for:  April 02, 2010.  Please transmit at any time this day.  If you have a wireless device your transmission will be sent automatically.     Sincerely,  Proofreader

## 2011-01-20 NOTE — Assessment & Plan Note (Signed)
Summary: f67m   Primary Zenya Hickam:  dr Ouida Sills  CC:  check up.  History of Present Illness: [name] is 76 years old and return for management of his pacemaker. He had a Medtronic DDD pacemaker implanted in August of 2007 for AV block. On his last visit here he had a high threshold on the ventricular lead with a high impedance. We switched him to unipolar and pressure was a little better. We brought him back today for reevaluation.  He also has heavy coronary calcification but has had a negative Myoview. He also has a history of atrial flutter related to alcohol consumption and has a history of alcohol consumption.  His other problems include hypertension, hyperlipidemia, and positive cigarette use.  Current Medications (verified): 1)  Simvastatin 40 Mg Tabs (Simvastatin) .... Take One Tablet By Mouth Daily At Bedtime 2)  Lisinopril 10 Mg Tabs (Lisinopril) .... Take One Tablet By Mouth Daily 3)  Aspirin 81 Mg Tbec (Aspirin) .... Take One Tablet By Mouth Daily 4)  Omeprazole 20 Mg Cpdr (Omeprazole) .... 3 X A Week 5)  Hydrocordone .... Shoulder Pain 6)  Multivitamins   Tabs (Multiple Vitamin) .[name].. 1 Tab By Mouth Once Daily 7)  Clonazepam 1 Mg Tabs (Clonazepam) .... As Directed  Allergies: No Known Drug Allergies  Past History:  Past Medical History: Reviewed history from 06/23/2009 and no changes required. 1. 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 2. Hypertension, borderline hypertension, currently. 3. Heavy coronary calcification with nonischemic Myoview scan. 4. Hyperlipidemia. 5. History of atrial flutter with alcohol consumption. 6. History of alcohol use in the past. 7. Current cigarette use.   Review of Systems       ROS is negative except as outlined in HPI.   Vital Signs:  Patient profile:   76 year old male Height:      68 inches Weight:      189 pounds BMI:     28.84 Pulse rate:   80 /  minute Resp:     14 per minute BP sitting:   136 / 70  (left arm)  Vitals Entered By: Kem Parkinson (October 13, 2010 9:07 AM)  Physical Exam  Additional Exam:  Gen. Well-nourished, in no distress   Neck: No JVD, thyroid not enlarged, no carotid bruits Lungs: No tachypnea, clear without rales, rhonchi or wheezes Cardiovascular: Rhythm regular, PMI not displaced,  heart sounds  normal, no murmurs or gallops, no peripheral edema, pulses normal in all 4 extremities. Abdomen: BS normal, abdomen soft and non-tender without masses or organomegaly, no hepatosplenomegaly. MS: No deformities, no cyanosis or clubbing   Neuro:  No focal sns   Skin:  no lesions    PPM Specifications Following MD:  Everardo Beals. Juanda Chance, MD     PPM Vendor:  Medtronic     PPM Model Number:  ADDR01     PPM Serial Number:  [id number] PPM DOI:  07/22/2006      Lead 1    Location: RA     DOI: 07/22/2006     Model #: 4098     Serial #: [id number]     Status: active Lead 2    Location: RV     DOI: 07/22/2006     Model #: 6213     Serial #: [id number]     Status: active   Indications:  Completle heart block with syncope   PPM Follow Up  Pacer Dependent:  Yes      Episodes Coumadin:  No  Parameters Mode:  DDDR     Lower Rate Limit:  60     Upper Rate Limit:  130 Paced AV Delay:  200     Sensed AV Delay:  200  Impression & Recommendations:  Problem # 1:  PACEMAKER (ICD-V45.[name]01) He has a high threshold on the ventricular lead is totally pacer dependent. His impedances gradually progressive. I'm not sure if this is a problem at the lead myocardial interface or if this might be a fracture of the clavicle. I've asked Dr. already to see him with plans to place a new ventricular lead and change his generator.  Problem # 2:  HYPERTENSION, BENIGN (ICD-401.1) This is well-controlled on current medications. His updated medication list for this problem includes:    Lisinopril 10 Mg Tabs (Lisinopril) .[name]... Take one tablet by  mouth daily    Aspirin 81 Mg Tbec (Aspirin) .[name]... Take one tablet by mouth daily  Patient Instructions: 1)  Refer to Dr Johney Frame for lead revison and generator change.

## 2011-01-20 NOTE — Miscellaneous (Signed)
Summary: dx correction  Clinical Lists Changes  Problems: Changed problem from PACEMAKER (ICD-V45..01) to PACEMAKER, PERMANENT (ICD-V45.01)  changed the incorrect dx code to correct dx code 

## 2011-01-20 NOTE — Cardiovascular Report (Signed)
Summary: Office Visit Remote   Office Visit Remote   Imported By: Roderic Ovens 01/31/2010 16:14:16  _____________________________________________________________________  External Attachment:    Type:   Image     Comment:   External Document

## 2011-01-20 NOTE — Cardiovascular Report (Signed)
Summary: Office Visit Remote  Office Visit Remote   Imported By: Roderic Ovens 04/09/2010 14:51:34  _____________________________________________________________________  External Attachment:    Type:   Image     Comment:   External Document

## 2011-01-20 NOTE — Assessment & Plan Note (Signed)
Summary: [name]   Primary Provider:  dr Ouida Sills   History of Present Illness: The patient is 76 years old and returns for management of his pacemaker he had a Medtronic DDD pacemaker implanted for complete AV block in 2007. At that time his implant he was known to have heavy coronary calcification and he underwent evaluation with a Myoview scan which was negative for ischemia. He also has a history of atrial flutter which was associated with alcohol consumption.  He has done well with his heart with no symptoms of chest pain or palpitations. He says he may have some decreased exercise tolerance last few months. He was brought in early because his telephone check showed a increase in impedance and his ventricular lead and an increase in his auto threshold.  He has a history of previous alcohol consumption cigarette use but has stopped these. He is now back to raising cattle.  he also has episodes when he is sleeping in a chair where he gets paralysis of both his upper and lower extremities and also has some garbled speech. If he sits up this resolved. He has had 3 episodes in the last 3-4 weeks. He has not had symptoms suggestive of obstructive sleep apnea in that he has not had any snoring or any apneic spells witnessed by his wife. His wife is with him today.  Allergies: No Known Drug Allergies  Past History:  Past Medical History: Reviewed history from 06/23/2009 and no changes required. 1. 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 2. Hypertension, borderline hypertension, currently. 3. Heavy coronary calcification with nonischemic Myoview scan. 4. Hyperlipidemia. 5. History of atrial flutter with alcohol consumption. 6. History of alcohol use in the past. 7. Current cigarette use.   Review of Systems       ROS is negative except as outlined in HPI.   Vital Signs:  Patient profile:   76 year old male Height:      68  inches Weight:      194 pounds BMI:     29.60 Pulse rate:   61 / minute BP sitting:   109 / 59  (left arm) Cuff size:   regular  Vitals Entered By: Burnett Kanaris, CNA (Apr 21, 2010 1:52 PM)  Physical Exam  Additional Exam:  Gen. Well-nourished, in no distress   Neck: No JVD, thyroid not enlarged, no carotid bruits Lungs: No tachypnea, clear without rales, rhonchi or wheezes Cardiovascular: Rhythm regular, PMI not displaced,  heart sounds  normal, no murmurs or gallops, no peripheral edema, pulses normal in all 4 extremities. Abdomen: BS normal, abdomen soft and non-tender without masses or organomegaly, no hepatosplenomegaly. MS: No deformities, no cyanosis or clubbing   Neuro:  No focal sns   Skin:  no lesions    PPM Specifications Following MD:  Everardo Beals. Juanda Chance, MD     PPM Vendor:  Medtronic     PPM Model Number:  ADDR01     PPM Serial Number:  [id number] PPM DOI:  07/22/2006      Lead 1    Location: RA     DOI: 07/22/2006     Model #: 4098     Serial #: [id number]     Status: active Lead 2    Location: RV     DOI: 07/22/2006     Model #: 6213     Serial #: [id number]  Status: active   Indications:  Completle heart block with syncope   PPM Follow Up Pacer Dependent:  Yes      Episodes Coumadin:  No  Parameters Mode:  DDDR     Lower Rate Limit:  60     Upper Rate Limit:  130 Paced AV Delay:  200     Sensed AV Delay:  200  Impression & Recommendations:  Problem # 1:  PACEMAKER (ICD-V45.[name]01) We will interrogate his pacemaker today and evaluate his elevated impedance and threshold on the ventricular lead.  We interrogated his pacemaker today. Over the past few months his ventricular lead impedance is increased from about 420-757. His ventricular threshold has risen to 2.25. In the unipolar mode his impedance was slightly less and his threshold was 1.5 and we left him in the unipolar mode. We will check him every 3 months and if there continues to be a trend upward will  see him back sooner. It is possible he may need a new lead. I plan to see him in 6 months. Princella Ion saw him with me today.  he is programmed on a threshold and the maximum output from his generator is 7.5 V.  Problem # 2:  CAD, NATIVE VESSEL (ICD-414.01)  Because of heavy coronary calcification we are treating him as secondary risk factor modification. He has had no chest pain. His updated medication list for this problem includes:    Lisinopril 10 Mg Tabs (Lisinopril) .[name]... Take one tablet by mouth daily    Aspirin 81 Mg Tbec (Aspirin) .[name]... Take one tablet by mouth daily  Orders: EKG w/ Interpretation (93000)  Problem # 3:  TRANSIENT ISCHEMIC ATTACK (ICD-435.9) He has had unusual spells when he is sleeping in his recliner. He developed paralysis of his upper and lower extremities and garbled speech. This resolves when he completely sits up. It does not sound like sleep apnea. We will plan to get a neurological consultation for these transient neurological deficits.  Other Orders: Neurology Referral (Neuro)  Patient Instructions: 1)  You are being referred to Neurology. 2)  Your physician wants you to follow-up in: 6 months.  You will receive a reminder letter in the mail two months in advance. If you don't receive a letter, please call our office to schedule the follow-up appointment.

## 2011-01-20 NOTE — Assessment & Plan Note (Signed)
Summary: 1 yr f/u   Visit Type:  Follow-up Primary Provider:  dr Ouida Sills  CC:  no complaints.  History of Present Illness: The patient is 76 years old and returns for management of his pacemaker he had a Medtronic DDD pacemaker implanted for complete AV block in 2007. At that time his implant he was known to have heavy coronary calcification and he underwent evaluation with a Myoview scan which was negative for ischemia. He also has a history of atrial flutter which was associated with alcohol consumption.  When I saw him last time he had a high impedance on the ventricular lead and his threshold had increased. We switched him to unipolar mode. His pressure was 1.5 at that time and is impedance was about 700 at that time on the unipolar mode. He has been patient dependent.  He also had noted some paralysis in his lower extremities after waking up from sleep. He was seen by neurology and had a CT scan of his head which didn't show any major abnormality and it was decided not to pursue further workup at this time.  Current Medications (verified): 1)  Simvastatin 40 Mg Tabs (Simvastatin) .... Take One Tablet By Mouth Daily At Bedtime 2)  Lisinopril 10 Mg Tabs (Lisinopril) .... Take One Tablet By Mouth Daily 3)  Aspirin 81 Mg Tbec (Aspirin) .... Take One Tablet By Mouth Daily 4)  Omeprazole 20 Mg Cpdr (Omeprazole) .... 3 X A Week 5)  Hydrocordone .... Shoulder Pain 6)  Multi Vit .... Daily 7)  Clonazepam 1 Mg Tabs (Clonazepam) .... As Directed  Allergies (verified): No Known Drug Allergies  Past History:  Past Medical History: Reviewed history from 06/23/2009 and no changes required. 1. 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 2. Hypertension, borderline hypertension, currently. 3. Heavy coronary calcification with nonischemic Myoview scan. 4. Hyperlipidemia. 5. History of atrial flutter with alcohol consumption. 6.  History of alcohol use in the past. 7. Current cigarette use.   Review of Systems       ROS is negative except as outlined in HPI.   Vital Signs:  Patient profile:   76 year old male Height:      68 inches Weight:      190 pounds Pulse rate:   86 / minute BP sitting:   120 / 70  (left arm) Cuff size:   regular  Vitals Entered By: Burnett Kanaris, CNA (July 10, 2010 3:44 PM)  Physical Exam  Additional Exam:  Gen. Well-nourished, in no distress   Neck: No JVD, thyroid not enlarged, no carotid bruits Lungs: No tachypnea, clear without rales, rhonchi or wheezes Cardiovascular: Rhythm regular, PMI not displaced,  heart sounds  normal, no murmurs or gallops, no peripheral edema, pulses normal in all 4 extremities. Abdomen: BS normal, abdomen soft and non-tender without masses or organomegaly, no hepatosplenomegaly. MS: No deformities, no cyanosis or clubbing   Neuro:  No focal sns   Skin:  no lesions    PPM Specifications Following MD:  Everardo Beals. Juanda Chance, MD     PPM Vendor:  Medtronic     PPM Model Number:  ADDR01     PPM Serial Number:  [id number] PPM DOI:  07/22/2006      Lead 1    Location: RA     DOI: 07/22/2006     Model #: 5638     Serial #: [id number]  Status: active Lead 2    Location: RV     DOI: 07/22/2006     Model #: 1610     Serial #: [id number]     Status: active   Indications:  Completle heart block with syncope   PPM Follow Up Remote Check?  No Battery Voltage:  2.74 V     Battery Est. Longevity:  2 years     Pacer Dependent:  Yes       PPM Device Measurements [facility]  Amplitude: 2.8 mV, Impedance: 459 ohms, Threshold: 1.0 V at 0.4 msec Right Ventricle  Impedance: 892 ohms, Threshold: 1.5 V at 1.0 msec  Episodes Coumadin:  No  Parameters Mode:  DDDR     Lower Rate Limit:  60     Upper Rate Limit:  130 Paced AV Delay:  200     Sensed AV Delay:  200 Tech Comments:  Ventricular impedance slowing increasing.   Capture threshold stable.  ROV 3 months. Altha Harm, LPN  July 10, 2010 4:18 PM   Impression & Recommendations:  Problem # 1:  PACEMAKER (ICD-V45.[name]01) He has a DDD pacemaker implanted in 2007 for AV block. He has a high impedance on the ventricular lead and we have reprogrammed him to unipolar mode. His impedance has gone up slightly since his last visit but his pressure was unchanged. We will continue to follow this closely and we'll see him back in 3 months. He is totally pacer dependent.  Problem # 2:  HYPERTENSION, BENIGN (ICD-401.1) He had been on lisinopril for hypertension but he got dizzy and his blood pressures measured at home were on the low side so he stopped this. His blood pressure is 120/80 today. We will leave him off the lisinopril for now. He will follow up with his primary care physician. His updated medication list for this problem includes:    Lisinopril 10 Mg Tabs (Lisinopril) .[name]... Take one tablet by mouth daily    Aspirin 81 Mg Tbec (Aspirin) .[name]... Take one tablet by mouth daily  Problem # 3:  CAD, NATIVE VESSEL (ICD-414.01)  He has very heavy coronary calcification so we are treating him with secondary prevention he is on aspirin and simvastatin. His updated medication list for this problem includes:    Lisinopril 10 Mg Tabs (Lisinopril) .[name]... Take one tablet by mouth daily    Aspirin 81 Mg Tbec (Aspirin) .[name]... Take one tablet by mouth daily  Orders: EKG w/ Interpretation (93000)  Patient Instructions: 1)  Your physician recommends that you schedule a follow-up appointment in: 3 months. 2)  Your physician recommends that you continue on your current medications as directed. Please refer to the Current Medication list given to you today.
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
## 2011-01-20 NOTE — Letter (Signed)
Summary: [facility] Office Visit Note   [facility] Office Visit Note   Imported By: Roderic Ovens 09/19/2010 11:52:58  _____________________________________________________________________  External Attachment:    Type:   Image     Comment:   External Document

## 2011-01-20 NOTE — Letter (Signed)
Summary: Implantable Device Instructions  Architectural technologist, Main Office  [address]   [place], [place] [address]   Phone: [phone]  Fax: [phone]      Implantable Device Instructions  You are scheduled for:  Pcer lead revision  on 10/28/10 with Dr. Johney Frame.  1.  Please arrive at the Short Stay Center at [facility] at 10:30am on the day of your procedure.  2.  Do not eat or drink after midnight  the night before your procedure.  3.  Complete lab work on 10/21/10.  at 11:00am.  You do not have to be fasting.  4.  Plan for an overnight stay.  Bring your insurance cards and a list of your medications.  5.  Wash your chest and neck with antibacterial soap (any brand) the evening before and the morning of your procedure.  Rinse well.   *If you have ANY questions after you get home, please call the office [phone].  [name]  *Every attempt is made to prevent procedures from being rescheduled.  Due to the nauture of Electrophysiology, rescheduling can happen.  The physician is always aware and directs the staff when this occurs.

## 2011-01-20 NOTE — Cardiovascular Report (Signed)
Summary: Pre Op Orders   Pre Op Orders   Imported By: Roderic Ovens 10/17/2010 13:02:02  _____________________________________________________________________  External Attachment:    Type:   Image     Comment:   External Document
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
## 2011-01-28 NOTE — Cardiovascular Report (Signed)
Summary: Office Visit   Office Visit   Imported By: Roderic Ovens 01/21/2011 15:16:03  _____________________________________________________________________  External Attachment:    Type:   Image     Comment:   External Document

## 2011-03-04 LAB — CBC
MCH: 34.2 pg — ABNORMAL HIGH (ref 26.0–34.0)
MCHC: 33.9 g/dL (ref 30.0–36.0)
MCV: 100.8 fL — ABNORMAL HIGH (ref 78.0–100.0)
Platelets: 114 10*3/uL — ABNORMAL LOW (ref 150–400)
RBC: 3.98 MIL/uL — ABNORMAL LOW (ref 4.22–5.81)
RDW: 13.9 % (ref 11.5–15.5)
WBC: 4.9 10*3/uL (ref 4.0–10.5)

## 2011-03-04 LAB — BASIC METABOLIC PANEL
CO2: 26 mEq/L (ref 19–32)
GFR calc Af Amer: >60 mL/min (ref >60)
Glucose, Bld: 115 mg/dL — ABNORMAL HIGH (ref 70–99)
Potassium: 4.2 mEq/L (ref 3.5–5.1)
Sodium: 137 mEq/L (ref 135–145)

## 2011-03-04 LAB — PROTIME-INR: INR: 1.01 (ref 0.00–1.49)

## 2011-05-05 NOTE — Assessment & Plan Note (Signed)
[facility]                            CARDIOLOGY OFFICE NOTE   NAME:[name], [name]                      MRN:          [id number]  DATE:06/20/2008                            DOB:          Jul 24, 1935    PRIMARY CARE PHYSICIAN:  Kingsley Callander. Ouida Sills, MD   CLINICAL HISTORY:  [name] is 76 years old and returned for followup  management of his pacemaker.  In 2007, he developed complete AV block  and underwent implantation of a Medtronic Adapta DDD pacemaker.  He has  done quite well since that time with no recent symptoms of chest pain,  shortness breath or palpitations.  He also had a history of paroxysmal  atrial flutter which appeared to be related to alcohol consumption, but  he had no recurrent symptoms of palpitations.  When we put his pacemaker  in we documented heavy calcification of his coronary arteries and we did  a Myoview scan, which was negative for ischemia.   PAST MEDICAL HISTORY:  Significant for hyperlipidemia.   CURRENT MEDICATIONS:  1. simvastatin 40 mg daily.  2. Prilosec.  3. Aspirin 81 mg daily.  4. Multivitamins.  5. Hydrocodone for shoulder pain.   PHYSICAL EXAMINATION:  VITALS:  Blood pressure 140/80, the pulse 66 and  regular.  There is no venous tension.  The carpals were full without  bruits.  CHEST:  Clear.  CARDIAC: Rhythm was regular.  No murmurs or gallops.  ABDOMEN:  Soft with normal bowel sounds.  EXTREMITIES:  Peripheral pulses full and no peripheral edema.   His electrocardiogram showed atrial tracking and ventricular pacing.  On  interrogation of his pacemaker, he had good thresholds on both channels.  He was pacer-dependent with complete AV block.  He was tracking his  atrium and pacing his ventricle all the time.   IMPRESSION:  1. Status post Medtronic DDD pacemaker for complete AV block with good      pacer function - pacer dependent.  2. Hypertension, borderline hypertension, currently.  3. Heavy coronary  calcification with nonischemic Myoview scan.  4. Hyperlipidemia.  5. History of atrial flutter with alcohol consumption.  6. History of alcohol use in the past.  7. Current cigarette use.   RECOMMENDATIONS:  I think [name] is doing well from the standpoint  of his heart and his pacemaker.  We did not make any changes.  We will  plan to see him back in followup in 1 year when we will follow him on  Caltrate in the interim.  He indicated his recent laboratory work  including cholesterol and hemoglobin A1c with Dr. Ouida Sills were good.  He  will need to continue to work on smoking cessation.      Bruce Elvera Lennox Juanda Chance, MD, [facility]  Electronically Signed     Everardo Beals. Juanda Chance, MD, [facility]  Electronically Signed   BRB/MedQ  DD: 06/20/2008  DT: 06/21/2008  Job #: [id number]

## 2011-05-05 NOTE — Assessment & Plan Note (Signed)
[facility]                            CARDIOLOGY OFFICE NOTE   NAME:[name]                      MRN:          [id number]  DATE:07/19/2007                            DOB:          [birth year]    PRIMARY CARE PHYSICIAN:  Kingsley Callander. Ouida Sills, M.D.   PAST MEDICAL HISTORY:  [name] is 76 years old and returns for  management of his pacemaker.  A year ago he had a Medtronic Adapta DDD  pacemaker implanted for complete AV block.  He has done quite well since  that time.  Has had no recent chest pain, shortness of breath, or  palpitations.   PAST MEDICAL HISTORY:  Significant for a history of heavy calcification  in his aortic valve found on fluoroscopy.  He subsequently had a  negative Myoview scan.  However, he has been on secondary risk factor  modification.  He also has a history of paroxysmal atrial flutter  related to alcohol consumption.  He has a history of alcohol  consumption, but says he has not drunk anything in 4 months.   PAST HISTORY:  Also significant for hyperlipidemia.   CURRENT MEDICATIONS:  Simvastatin.  Prilosec.  Aspirin.   EXAMINATION:  Blood pressure 126/70, pulse 84 and regular.  There was no venous distension.  Carotid pulses were full without  bruits.  CHEST:  Clear.  CARDIAC:  Rhythm was regular.  I could hear no murmurs or gallops.  ABDOMEN:  Soft with normal bowel sounds.  There is no  hepatosplenomegaly.  Peripheral pulses were full and there is no  peripheral edema.   Rechecked his pacemaker and he had good threshold with good lead  impendence on both leads.  He was pacer dependence.  He was pacing the  ventricle about 57% of the time.   IMPRESSION:  1. Status post Medtronic dual-mode, dual-pacing, dual-sensing      pacemaker for complete AV block with good pacer function - pacer      dependent.  2. Hypertension, controlled.  3. Coronary artery calcification on nonischemic Myoview.  4. Hyperlipidemia.  5.  History of remote atrial flutter related to alcohol consumption.  6. History of alcohol use, now off of alcohol.   RECOMMENDATIONS:  I think [name] is doing quite well.  We will plan  to follow him with phone checks and will see him back for a pacemaker  check in a year.     Bruce Elvera Lennox Juanda Chance, MD, [facility]  Electronically Signed    BRB/MedQ  DD: 07/19/2007  DT: 07/20/2007  Job #: [id number]

## 2011-05-08 NOTE — Op Note (Signed)
NAME:  [name], [name]                         ACCOUNT NO.:  [id number]   MEDICAL RECORD NO.:  [id number]                   PATIENT TYPE:  AMB   LOCATION:  DAY                                  FACILITY:  [facility]   PHYSICIAN:  R. Roetta Sessions, M.D.              DATE OF BIRTH:  [birth year]   DATE OF PROCEDURE:  05/09/2004  DATE OF DISCHARGE:                                 OPERATIVE REPORT   PROCEDURE:  Colonoscopy with biopsy.   INDICATIONS FOR PROCEDURE:  The patient is a 76 year old gentleman sent over  for a colonoscopy by Kingsley Callander. Ouida Sills, M.D.  He is requesting colorectal cancer  screening. [name] has no GI symptoms. He tells me he had a sigmoidoscopy  back in 1992 by Kingsley Callander. Ouida Sills, M.D. and there were no significant findings.  He has a history of non-Hodgkin's lymphoma and had a CT scan recently for  followup and was reported to have diverticulosis.  There is no family  history of colorectal neoplasia and he has never had his entire lower GI  tract imaged. Colonoscopy is now being done as a screening maneuver. This  approach has been discussed with the patient at length at the bedside. The  potential risks, benefits, and alternatives have been reviewed, questions  answered.   MONITORING:  O2 saturation, blood pressure, pulse and respirations were  monitored throughout the entire procedure.   CONSCIOUS SEDATION:  Versed 3 mg IV, Demerol 75 mg IV in divided doses.   INSTRUMENT:  Olympus videochip system.   FINDINGS:  Digital rectal exam revealed no abnormalities.   ENDOSCOPIC FINDINGS:  The prep was good.   RECTUM:  Examination of the rectal mucosa including retroflexed view of the  anal verge revealed no abnormalities.   COLON:  The colonic mucosa was surveyed from the rectosigmoid junction  through the left transverse right colon to the area of the appendiceal  orifice, ileocecal valve and cecum. These structures were seen and  photographed for the record. From this level,  the scope was slowly  withdrawn. All previously mentioned mucosal surfaces were again seen. The  patient was noted to have left sided diverticula and a 3 mm polypoid lesion  at the ileocecal valve cold biopsied/removed (see photos).  The remainder of  the colonic mucosa appeared normal.  The patient tolerated the procedure  well.   IMPRESSION:  1. Normal rectum.  2. Sigmoid diverticulum. The remainder of the colonic mucosa appeared normal     except for a polypoid lesion (3 mm) on the ileocecal valve cold     biopsied/removed.   RECOMMENDATIONS:  1. Diverticulosis literature provided to the patient.  2. Followup on path.  3. Further recommendations to follow.  Jonathon Bellows, M.D.    RMR/MEDQ  D:  05/09/2004  T:  05/10/2004  Job:  [id number]   cc:   Kingsley Callander. Ouida Sills, M.D.  [address]  [place]  [place] [address]  Fax: [phone]

## 2011-05-08 NOTE — Assessment & Plan Note (Signed)
[facility]                              CARDIOLOGY OFFICE NOTE   NAME:[name], [name]                      MRN:          [id number]  DATE:10/01/2006                            DOB:          12-25-1934    PRIMARY CARE PHYSICIAN:  Kingsley Callander. Ouida Sills, MD.   PRIMARY CARDIOLOGIST:  Everardo Beals Juanda Chance, MD, [facility]   HISTORY OF PRESENT ILLNESS:  [name] is a 76 year old male patient with a  history of syncope and complete heart block, status post implantation of a  Medtronic Adapta DDD pacemaker July 22, 2006, by Dr. Juanda Chance.  He had  marked calcification noted on fluoroscopy and was set up for a Myoview scan.  This revealed prior apical infarct, no ischemia and EF of 52%.  He had a  follow-up echocardiogram  done September 13, 2006, that showed an EF of 50-  55% and a normal LV size.  His left ventricular wall thickness was mildly  increased.  He had grade I diastolic dysfunction.  The patient presents to  the office today with complaints of elevated blood pressures.  He has been  checking these at home and he has noted them going up as high as 180s/100s  and heart rates into the 120s.  He notes that prior to his pacemaker, he was  on atenolol.  This was stopped and he was never put back on a blood pressure  medication.  He notes that he did have some fleeting abdominal pain the  other day but this quickly resolved.  There were no associated symptoms.  He  denies any chest pain.  He does note chronic dyspnea on exertion.  It has  probably been worse a little bit over the last several weeks.  He denies  orthopnea, paroxysmal nocturnal dyspnea, pedal edema, palpitations or  syncope.  He did have a mild headache and some lightheadedness.   CURRENT MEDICATIONS:  1. Simvastatin 40 mg nightly.  2. Multivitamin daily.  3. Prilosec.  4. Flaxseed oil.  5. Aspirin 81 mg a day.  6. Wellbutrin.   SOCIAL HISTORY:  The patient does smoke cigarettes off and on.  He  also  drinks alcohol on occasion.  He actually just returned from a trip to  [place], [place].  He drank alcoholic drinks in the casino all day this  past weekend on Saturday.  He also ate more than he usually eats and did not  follow his diet.   PHYSICAL EXAMINATION:  GENERAL APPEARANCE:  He is well-developed, well-  nourished, in no distress.  VITAL SIGNS:  Blood pressure 155/94 with pulse of 98 lying, 146/89 with  pulse of 96 sitting, 145/93 with pulse of 103 standing, after 2 minutes  136/92 with a pulse of 102, after 5 minutes 131/87 with pulse 102.  HEENT:  Head normocephalic and atraumatic.  Eyes:  PERRLA, EOMI.  Sclerae  are clear.  NECK:  Without JVD.  LYMPHS:  Without lymphadenopathy.  LUNGS:  Clear to auscultation bilaterally without wheezing, rhonchi or  rales.  CARDIOVASCULAR:  Normal S1 and S2, regular rate and rhythm.  ABDOMEN:  Soft and nontender with normal active bowel sounds.  No  organomegaly.  EXTREMITIES:  Without edema.  Calves are soft and nontender.  SKIN:  Warm and dry.   Electrocardiogram reveals sinus rhythm with a heart rate of 93, left bundle  branch block.  Interrogation of his pacemaker shows it is functioning  appropriately.  He is A sensed, V sensed 97.3% of the time.   IMPRESSION:  1. Hypertension, uncontrolled.  2. Status post Medtronic DDD pacemaker for complete AV block.  3. Coronary artery calcification with nonischemic Myoview scan September      2007.  4. Low normal left ventricular function by recent echocardiogram.  5. Treated dyslipidemia.  6. History of remote atrial flutter in the setting of alcoholic      consumption.  7. History of alcohol use and alcoholic hepatitis.  8. Left bundle branch block.   PLAN:  The patient presents to the office today with complaints of worsening  blood pressure.  He was actually taken off of his blood pressure medication  prior to his pacemaker and has never had this restarted.  Patient has had   some symptoms of shortness of breath as well as dizziness and headache.  These have all been mild. These are possibly related to his hypertension.  He does have a high basal heart rate.  His pacemaker is functioning  appropriately.  I have elected to place him back on a beta-blocker with  Toprol XL 50 mg a day.  If he has a problem with fatigue, he is to contact  us.  He will continue to check his blood pressures at home and let us know  if there are any problems.  I will have him follow up with Dr. Juanda Chance in  December as scheduled.      ______________________________  Seth Newcomer, PA-C    ______________________________  Salvadore Farber, MD    SW/MedQ  DD:  10/01/2006  DT:  10/01/2006  Job #:  [id number]   cc:   Kingsley Callander. Ouida Sills, MD

## 2011-05-08 NOTE — Discharge Summary (Signed)
[name], [name]               ACCOUNT NO.:  [id number]   MEDICAL RECORD NO.:  [id number]          PATIENT TYPE:  IPS   LOCATION:  0502                          FACILITY:  [facility]   PHYSICIAN:  Geoffery Lyons, M.D.      DATE OF BIRTH:  September 04, 1935   DATE OF ADMISSION:  01/30/2007  DATE OF DISCHARGE:  01/31/2007                               DISCHARGE SUMMARY   IDENTIFYING INFORMATION:  This is a 76 year old white male who is  married.  This is a voluntary admission.   HISTORY OF PRESENT ILLNESS:  This 76 year old, with a history of prior  alcohol abuse, presented in the emergency room requesting help with  alcohol detox after he had been on a 10-day drinking binge, drinking  approximately one-fifth of alcohol daily.  He has been through rehab in  the past and has a pattern of being able to give up the alcohol for a  period of time and then will go on a binge.  He has drunk in binge  patterns during periods of sobriety.  He reports his mood is good.  Endorses having some depression over some recent financial stressors but  denies any history of suicidal ideation or prior suicide attempts.  Periods of sobriety have extended over six months.  He states that he  dislikes the taste of alcohol but drinks it for the effect which helps  him to relax and allows him to sleep at night.  He does endorse problems  with insomnia and has been given Valium 5 mg at various times.  Has a  current prescription for this to take as needed to help him sleep.  He  has a history of prior detox at [facility] in the past.   PAST PSYCHIATRIC HISTORY:  This is the patient's first admission to  [facility].  Has a history of prior detox at  [facility].  History of alcohol abuse.  Denies any other substance  abuse.  Periods of sobriety up to six months at a time.   SOCIAL HISTORY:  Retired, white male.  The patient has been married 51  years.  Wife is supportive.  Home situation  is stable.  No legal  problems.   MEDICAL HISTORY:  Patient is followed by Dr. Carylon Perches, his primary care  physician.  Medical problems include the patient has a history of  lymphoma that is in remission since 2004, history of arrhythmia.  The  patient has a insertion of a pacemaker in August and history of  dyslipidemia.   CURRENT MEDICATIONS:  The patient takes a flax seed oil supplement,  aspirin 81 mg daily and Toprol XL 25 mg daily for hypertension.   ALLERGIES:  No known drug allergies.   POSITIVE PHYSICAL FINDINGS:  The patient's full physical exam was done  in the emergency room.  It is noted in the record.  On admission to our  unit, well-nourished, well-developed male, appears to be his stated age,  in no acute distress.  He was gauged at 3 at the time of admission.  Height 5  feet 7 inches tall, weight 220 pounds, temperature 97.5, pulse  95, respirations 18, blood pressure 148/85.  Full physical exam was done  in the emergency room and is noted in the record.   LABORATORY DATA:  CBC with WBC 5.4, hemoglobin 14.6, hematocrit 42.1,  platelets 116,000.  Chemistries with sodium 143, potassium 4.0, chloride  104, carbon dioxide 29, BUN 7, creatinine 0.88.  Liver enzymes with SGOT  34, SGPT 28, alkaline phosphatase 57 and total bilirubin is 1.1, calcium  normal at 9.0.  The patient's alcohol level at the time of presentation  was 311.  His TSH is currently pending.  Urine drug screen was positive  for benzodiazepines.   MENTAL STATUS EXAM:  Fully alert male.  Pleasant, cooperative with good  insight.  Appropriate.  Speech normal in pace, tone, amount and  production.  Mood euthymic.  Insight is adequate.  No dangerous  ideations.  Talks about how alcohol has influenced his life.  He  recognizes that he needs to quit completely and wants to stay away from  the alcohol.  His wife is very supportive of him.  He does talk about a  lot of difficulty sleeping while he is here and  we have discussed the  impact of alcohol and the withdrawal process on his sleep.  She reports  that he has taken the Librium and is tolerating the detox well.  Cognition is intact to person, place, situation with good insight.  Calculation, registration and concentration all intact.  Insight  adequate.  Impulse control and judgment within normal limits.   DIAGNOSES:  AXIS I:  Alcohol abuse and dependence.  AXIS II:  Deferred.  AXIS III:  Dyslipidemia, arrhythmia with history of pacemaker insertion,  lymphoma in remission since 2004, rule out reactive thrombocytopenia  secondary to alcohol abuse.  AXIS IV:  Deferred.  AXIS V:  Current 58; past year 12.   PLAN:  To discharge the patient today to home.  We have explained the  Librium protocol to him and he will continue it on his own as an  outpatient.  He is tolerating it well.  He is alert and capable of  following instructions and would like to proceed on his own.  We have  also discussed with him the need to discontinue the Valium and to follow  through with the Librium detox protocol and we discussed the rationale  for discontinuing benzodiazepines in order to maintain sobriety from  alcohol.  We have continued his routine medications  and asked him to  follow up with Carylon Perches and the patient is in agreement with the plan.  His wife has been contacted and she is supportive of him coming home.      Margaret A. Scott, N.P.      Geoffery Lyons, M.D.  Electronically Signed    MAS/MEDQ  D:  01/31/2007  T:  02/01/2007  Job:  [id number]

## 2011-05-08 NOTE — Discharge Summary (Signed)
NAMETRENT, [name] NO.:  [id number]   MEDICAL RECORD NO.:  [id number]          PATIENT TYPE:  IPS   LOCATION:  0503                          FACILITY:  [facility]   PHYSICIAN:  Geoffery Lyons, M.D.      DATE OF BIRTH:  [birth year]   DATE OF ADMISSION:  09/11/2005  DATE OF DISCHARGE:  09/13/2005                                 DISCHARGE SUMMARY   CHIEF COMPLAINT AND PRESENT ILLNESS:  This was the first admission to [facility] for this 76 year old male who presented to the  emergency room.  He apparently had been in detox two weeks prior to this  admission.  He had relapsed, drinking constantly for several days.  He was  admitted to [facility] September 6th.  At that time, he was shaky.  Began  drinking bourbon six days prior to this admission associated with a trip to  the horse races in [place], [place].  He presented himself to his  primary physician.  He was found to be tachycardia, heart rate of 140.  EKG  revealed an atrial flutter.  He was discharged on Librium 50 mg every six  hours as needed, thiamine 100 mg daily.  He was treated with atenolol 50 mg  daily and Ambien CR 6.25 mg at night.  He was discharged September 8th with  follow-up in two weeks with his primary physician.   PAST PSYCHIATRIC HISTORY:  Inpatient stay in 1982 at [facility].   ALCOHOL/DRUG HISTORY:  Started drinking at age 26.  Can use as much as a  fifth of liquor a day and also uses benzodiazepines.   MEDICAL HISTORY:  Atrial flutter, alcoholic hepatitis, hypertension, type 2  diabetes mellitus, history of non-Hodgkin's lymphoma, history of  hyperuricemia and gout and sleep apnea.   PHYSICAL EXAMINATION:  Performed and failed to show any acute findings.   LABORATORY DATA:  CBC with white blood cells 5.8, hemoglobin 14.0.  Blood  chemistry within normal limits.  Liver enzymes with SGOT 22, SGPT 22, total  bilirubin 1.4, cholesterol 144, triglycerides 613, TSH  3.775.   MENTAL STATUS EXAM:  Upon admission revealed an alert, cooperative male  adequately dressed and groomed.  Good eye contact.  Speech was spontaneous,  fluent, not pressured.  Mood was depressed and anxious, appropriate to the  situation.  Affect was congruent.  Thought processes were clear, rational  and goal-oriented.  Endorsed that he knew that he had to stop drinking.  He  was willing to stay in the unit only two days.  Denied any suicidal or  homicidal ideation.  Denied any hallucinations.  Cognition was well-  preserved.   ADMISSION DIAGNOSES:  AXIS I:  Major depression.  Alcohol dependence.  AXIS II:  No diagnosis.  AXIS III:  Hypertension, restless leg syndrome, sleep apnea, hyperuricemia,  gout, diabetes mellitus and non-Hodgkin's lymphoma.  AXIS IV:  Moderate.  AXIS V:  GAF upon admission 30-35; highest GAF in the last year 60.   HOSPITAL COURSE:  He was admitted.  He was started in individual and group  psychotherapy.  He was maintained on the atenolol.  He was detoxified with  Librium.  He was started on Wellbutrin XL 150 mg in the morning.  He  endorsed that, after discharge from [facility], he was given Librium 50 mg  every six hours as needed.  He was detoxed while in the unit.  He was in the  hospital from August 26, 2005 to August 28, 2005.  Endorsed that he  continued to drink while taking the Librium up to 5 Librium a day.  He  endorsed that he agreed to come into the unit for two days to get detoxed so  he could go on, saying that he could do it on his own, just needed two days.  Minimizes and denies.  Detox went uneventfully.  Endorsed that he stayed  busy.  He was retired.  Lived with his wife whom he said was supportive.  On  September 24th, he wanted to be discharged.  Wife had no objection.  Vital  signs were stable.  Endorsed that he was not going to use the Librium he had  back at home.  He was going to pursue the antidepressant.  Endorsed he was   committed to abstinence.  He was going to follow with his primary care  Takeila Thayne.  Endorsed no active suicidal or homicidal ideation.  There were no  active evidence of withdrawal.   DISCHARGE DIAGNOSES:  AXIS I:  Alcohol dependence.  Depressive disorder not  otherwise specified.  AXIS II:  No diagnosis.  AXIS III:  Hypertension, restless leg syndrome, sleep apnea, hyperuricemia,  gout, diabetes mellitus, non-Hodgkin's lymphoma in remission.  AXIS IV:  Moderate.  AXIS V:  GAF on discharge 55-60.   DISCHARGE MEDICATIONS:  1.  Librium taper 25 mg at 1 p.m. and 1 at 6 p.m. on September 24th, then 1      at 9 in the morning, 1 at 6 p.m. September 25th, then 1 at 9 a.m.      September 26th, then discontinued.  2.  Wellbutrin XL 150 mg in the morning.   Advised not to take any Librium from the old prescription and not to drink  any alcohol.   FOLLOW UP:  He was going to follow up with primary care Lindora Alviar at [facility].      Geoffery Lyons, M.D.  Electronically Signed     IL/MEDQ  D:  10/12/2005  T:  10/13/2005  Job:  [id number]
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
## 2011-05-08 NOTE — H&P (Signed)
[name], [name]               ACCOUNT NO.:  [id number]   MEDICAL RECORD NO.:  [id number]          PATIENT TYPE:  IPS   LOCATION:  0503                          [facility]ACILITY:  [facility]   PHYSICIAN:  Geoffery Lyons, M.D.      DATE OF BIRTH:  1935-03-24   DATE OF ADMISSION:  09/11/2005  DATE OF DISCHARGE:                         PSYCHIATRIC ADMISSION ASSESSMENT   IDENTIFYING INFORMATION:  This is a voluntary admission.  The patient  presented to the emergency department at [facility] last night.  His family advised that he had been in detox 2 weeks ago and had relapsed,  drinking constantly for several days.  They state that he does have alcohol  abuse and they were advised to bring the patient here for admission to  [facility].  He was admitted to [facility] on September 6.  At that  time, he was shaky.  He had begun drinking bourbon 6 days prior to his  admission, associated with a trip to the horse races in [place], [place].  He had been abstinent for several months until this episode and  on presentation to his private physician, Dr. Alonza Smoker office, he was found  to be tachycardic, with a heart rate in the 140s.  His EKG revealed atrial  flutter, and he was hospitalized.  He was discharged on Librium 50 mg q.6 h  p.r.n., thiamine 100 mg daily for 30 days, and a multivitamin.  He was also  treated with Atenolol 50 mg daily and Ambien CR 6.25 mg at h.s. p.r.n.  He  was discharged on September 8 with followup in 2 weeks at his private  physician's office.  Today, he is quite vocal.  I am only staying 2 days,  he says and insists that he will be discharged in the morning.  He is  however agreeable to starting an antidepressant and toward that end we will  start Wellbutrin.  He stated this was recommended by his physician.   PAST PSYCHIATRIC HISTORY:  He had an inpatient stay in 1982 at [facility].  He was diagnosed and treated in 2003 for non-Hodgkin's lymphoma,  and  his most recent inpatient admission was 2 weeks ago at [facility]   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  He has been employed in  [employer].  He was married once and he has 3 daughters ages 50, 5 and 23.   FAMILY HISTORY:  His mother had pancreatic cancer.  His father had COPD and  CHF, his sister died of gynecological cancer at 37.   ALCOHOL AND DRUG ABUSE:  He states that he began using alcohol at age 60.  He can use as much as a fifth a day, and he also uses benzodiazepines.   PAST MEDICAL HISTORY:  His EKG in the ER last night showed left bundle  branch block.  He is known to have atrial flutter, alcohol abuse, alcoholic  hepatitis, hypertension, type 2 diabetes.  He is not treated with  medications for that right now.  History of non-Hodgkin's lymphoma, history  for hyperuricemia and  gout, and sleep apnea.   ALLERGIES:  No known drug allergies.   POSITIVE PHYSICAL FINDINGS:  PHYSICAL EXAMINATION:  He has gouges out of his  fingertips on his right hand.  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  Beyond that, he is somewhat obese and the  remainder of his physical examination was well documented in the emergency  room.   MENTAL STATUS EXAM:  He is alert and oriented x3.  He is appropriately  groomed, casually dressed, adequately nourished.  He has good eye contact.  His speech is spontaneous, it is fluent, it is not pressured.  His mood is  depressed and anxious but appropriate to the situation.  His affect is  congruent.  Thought processes are clear, rational and goal oriented.  He  states he knows that he can stop drinking and he will.  Judgment and insight  are fair, concentration and memory allergic rhinitis intact, intelligence is  at least average.  He denies any suicidal or homicidal ideation.  He denies  auditory or visual hallucinations.  He has no visible tremor at this time.   ADMISSION DIAGNOSES:  AXIS I:  Major depressive  disorder, recurrent, alcohol  abuse rule out dependence.  AXIS II:  Deferred.  AXIS III:  Hypertension, restless legs syndrome, sleep apnea,  hyperuricemia/gout, diabetes mellitus, history for non-Hodgkin's lymphoma.  AXIS IV:  Chronic/severe.  AXIS V:  Global assessment of function is 25.   PLAN:  The plan is to support through detox and to start an antidepressant.  As already stated, we will be starting Wellbutrin XL 150 mg p.o. daily  starting today.      Mickie Leonarda Salon, P.A.-C.      Geoffery Lyons, M.D.  Electronically Signed    MD/MEDQ  D:  09/12/2005  T:  09/12/2005  Job:  [id number]

## 2011-05-08 NOTE — H&P (Signed)
NAME:  [name], [name]                         ACCOUNT NO.:  [id number]   MEDICAL RECORD NO.:  [id number]                   PATIENT TYPE:  INP   LOCATION:  A218                                 FACILITY:  [facility]   PHYSICIAN:  Kingsley Callander. Ouida Sills, M.D.                  DATE OF BIRTH:  07-23-1935   DATE OF ADMISSION:  04/22/2004  DATE OF DISCHARGE:  04/23/2004                                HISTORY & PHYSICAL   CHIEF COMPLAINT:  Left scapular pain.   HISTORY OF PRESENT ILLNESS:  This patient is a 76 year old white male with a  history of non-Hodgkin's lymphoma who presented with pain in the back in the  area on the left scapula.  This had begun approximately three weeks prior to  admission.  He has been concerned about having a recurrence of his non-  Hodgkin's lymphoma.  He began medicating himself with alcohol.  He presented  to Dr. Rolm Baptise, in [place], and was found to be intoxicated.  The decision was made to have him admitted, detoxed and evaluated for  possible recurrent lymphoma.  He has had recent fevers, chills and night  sweats.  He had been disease-free over multiple recent visits.  He was  originally diagnosed with lymphoma in 2003.  He was treated with  CHOP/Rituxan chemotherapy.  He denies any swollen lumps or bumps.  He has a  history of alcohol use and has had previous detox hospitalizations at both  [facility] and [facility].   PAST MEDICAL HISTORY:  1. Alcohol abuse.  2. Diabetes.  3. Hypertriglyceridemia.  4. Hypertension.  5. Toe ulcer following a burn which has now completely healed.  6. Restless leg syndrome.  7. Non-Hodgkin's lymphoma.  8. Gout.  9. Obstructive sleep apnea.  10.      Obesity.  11.      Left hydrocele.   MEDICATIONS:  1. Atenolol 50 mg daily.  2. TriCor 160 mg daily.  3. Mirapex 0.125 mg b.i.d.  4. Alprazolam 0.5 mg t.i.d. p.r.n.   ALLERGIES:  NONE.   SOCIAL HISTORY:  He is retired from [employer].  He has a past  history of  over 20 pack years of tobacco use.   FAMILY HISTORY:  His mother died of pancreatic cancer.  His father had COPD  and CHF.  His sister had a gynecologic cancer.   REVIEW OF SYSTEMS:  No anterior chest pain, cough, shortness of breath,  vomiting, diarrhea, difficulty voiding or skin problems.  He has continued  to eat with his alcohol use.   PHYSICAL EXAM:  GENERAL:  Alert and oriented but smells of alcohol.  HEENT:  No scleral icterus.  Pharynx is moist.  NECK:  Supple with no JVD or thyromegaly.  LUNGS:  Clear.  HEART:  Regular at 76 with no murmurs.  ABDOMEN:  Obese, nontender, no hepatosplenomegaly palpable.  EXTREMITIES:  No cyanosis, clubbing  or edema.  NEURO:  His gait is stable.  No focal deficits.  His speech is minimal  slurred.  LYMPH NODES:  No enlargement of the cervical, supraclavicular, axillary,  epitrochlear or inguinal nodes.   LABORATORY DATA:  White count 3.2, hemoglobin 12.7, platelets 87, sodium  137, potassium 3.6, glucose 95, BUN 11, creatinine 1.0, albumin 3.2, calcium  8.2, LDH 145, alcohol 133.   IMPRESSION:  1. Non-Hodgkin's lymphoma.  The left scapula area pain is minimally tender     on palpation.  There is no palpable mass.  His case has been discussed     with Dr. Truett Perna.  Since a bed was not available in [place] he is     being hospitalized at [facility] for further evaluation and detox from     alcohol.  He is pancytopenic.  Will obtain a CT scan of the neck, chest,     abdomen and pelvis.  2. Alcohol intoxication.  He will be treated with an alcohol detox regimen     of thiamine, multivitamin, folate, magnesium and Ativan.  3. Diabetes.  His glucose is normal now on non-drug therapy.     ___________________________________________                                         Kingsley Callander. Ouida Sills, M.D.   ROF/MEDQ  D:  04/23/2004  T:  04/23/2004  Job:  [id number]   cc:   Jillyn Hidden B. Truett Perna, M.D.  [address]- [facility]  [place]  [place] [address]  Fax:  [phone]

## 2011-05-08 NOTE — Discharge Summary (Signed)
[name], MINION               ACCOUNT NO.:  [id number]   MEDICAL RECORD NO.:  [id number]          PATIENT TYPE:  INP   LOCATION:  A218                          FACILITY:  [facility]   PHYSICIAN:  Kingsley Callander. Ouida Sills, MD       DATE OF BIRTH:  07/09/1935   DATE OF ADMISSION:  11/24/2006  DATE OF DISCHARGE:  12/07/2007LH                               DISCHARGE SUMMARY   DISCHARGE DIAGNOSES:  1. Alcohol withdrawal.  2. Suicide attempt.  3. Hypertension.  4. History of complete heart block with pacemaker placement.  5. Diabetes.  6. History of non-Hodgkin's lymphoma.  7. Restless leg syndrome.  8. Sleep apnea.  9. Gout.   DISCHARGE MEDICATIONS:  1. Metoprolol 25 mg daily.  2. Aspirin 81 mg daily.  3. Simvastatin 40 mg q.h.s.  4. Ativan 1 mg t.i.d.  5. Prilosec 20 mg daily.  6. Multivitamin daily.  7. Lexapro 10 mg daily.   HOSPITAL COURSE:  This patient is a 76 year old male who presented with  alcohol withdrawal after a recent alcohol binge.  He had been drinking  bourbon.  He had felt depressed.  He had attempted to shoot himself in  the head, but missed.  He was treated with an alcohol detox regimen of  thiamine, multivitamins, and Ativan.  He was seen by the ACT team and  arrangements were being made for outpatient psychiatric followup.  He  has been started on Lexapro for depression.   At the time of discharge he was not tremulous.  He was alert, oriented  and cooperative.  He denied feeling suicidal ideation.  He did not  appear depressed.  He was eating well.  His LFTs were normal except for  a mildly elevated AST of 40.  His alcohol level on admission was 269.  He had an MCV of 101.9.   He was counseled regarding the need for complete abstinence from  alcohol.   He will be seen in followup in the office in 1 week.  He will continue  Ativan 1 mg t.i.d. until then.      Kingsley Callander. Ouida Sills, MD  Electronically Signed     ROF/MEDQ  D:  11/26/2006  T:  11/26/2006  Job:   [id number]

## 2011-05-08 NOTE — H&P (Signed)
NAMEJAVANTE, [name]               ACCOUNT NO.:  [id number]   MEDICAL RECORD NO.:  [id number]          PATIENT TYPE:  INP   LOCATION:  A201                          FACILITY:  [facility]   PHYSICIAN:  [name]. Ouida Sills, MD       DATE OF BIRTH:  [birth year]   DATE OF ADMISSION:  08/26/2005  DATE OF DISCHARGE:  LH                                HISTORY & PHYSICAL   CHIEF COMPLAINT:  Feeling shaky.   HISTORY OF PRESENT ILLNESS:  This patient is a 76 year old white male who  presented to the office complaining of feeling shaky. He had begun drinking  bourbon again six days ago associated with a trip to the horse races in  [place], [place]. He has a history of alcohol abuse but had been  abstinent for several months until this latest episode. He had felt shaky  after approximately four hours since his last drink. On presentation to the  office, he was found to be tachycardic with a heart rate into the 140s. His  EKG revealed atrial flutter. He was therefore hospitalized in a monitored  setting for further treatment and evaluation. He has a history of  hypertension treated with atenolol. There is no history of thyroid disease  or valvular heart disease. He denied syncope or angina.   PAST MEDICAL HISTORY:  1.  Hypertension.  2.  Diabetes.  3.  Alcohol abuse.  4.  Non-Hodgkin's lymphoma.  5.  Restless leg syndrome.  6.  Hyperuricemia/gout.  7.  Sleep apnea.   MEDICATIONS:  1.  Atenolol 50 mg daily.  2.  Mirapex 0.125 sporadically.   ALLERGIES:  None.   SOCIAL HISTORY:  He quit smoking several years ago. 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   FAMILY HISTORY:  His mother had pancreatic cancer. His father had COPD and  CHF. His sister died of a gynecological cancer at 32.   REVIEW OF SYSTEMS:  No syncope, chest pain, fever, chills, vomiting, change  in bowel habits or difficulty voiding. He had been eating regularly. He has  no problems sleeping now.   PHYSICAL  EXAMINATION:  VITAL SIGNS:  Weight 220. Blood pressure 114/76,  pulse 140.  GENERAL:  Overweight, alert with a smell of alcohol.  HEENT:  No sclerae icterus. Oropharynx appears normal.  NECK:  No JVD or thyromegaly.  LUNGS:  Clear.  HEART:  Tachycardic. No murmurs.  ABDOMEN:  Nontender. No hepatosplenomegaly.  EXTREMITIES:  No clubbing, cyanosis, or edema. He has a large bunion/tophus  on the left foot.  NEUROLOGICAL:  He is mildly unsteady on Romberg testing. He is able to walk  independently. He has mild shakiness with out-stretched hands. He is fully  oriented.   LABORATORY DATA:  White count 5.8, hemoglobin 15.1, platelets 106. Sodium  135, potassium 3.7, bicarb 20, glucose 127, BUN 18, creatinine 1.1. SGOT 60,  SGPT 63, albumin 3.5, calcium 8.7, magnesium 1.9. CPK 59 with a MB of 2.5,  troponin I 0.05. TSH 3.1. His EKG reveals atrial flutter at 140.   IMPRESSION:  1.  Atrial flutter with rapid ventricular response. This is likely due to a      holiday heart type syndrome, i.e. related to alcohol. He will be      hospitalized on a monitored setting on 2A. His rate will be controlled      with IV diltiazem.  2.  Alcohol withdrawal. Will treat with an alcohol detox regimen of      thiamine, multivitamins, magnesium, and Librium.  3.  Non-Hodgkin's lymphoma. He has been disease free. He has been followed      routinely by Dr. Truett Perna. He has a mild chronic thrombocytopenia.  4.  Diabetes. Initial glucose was 127.  5.  Alcoholic hepatitis. He has mild elevations in his LFTs.  6.  Hypertension. He is currently normotensive.      [name]. Ouida Sills, MD  Electronically Signed     ROF/MEDQ  D:  08/27/2005  T:  08/27/2005  Job:  [id number]

## 2011-05-08 NOTE — H&P (Signed)
NAME:  [name], [name]                         ACCOUNT NO.:  [id number]   MEDICAL RECORD NO.:  [id number]                   PATIENT TYPE:  INP   LOCATION:  A228                                 FACILITY:  [facility]   PHYSICIAN:  Kingsley Callander. Ouida Sills, M.D.                  DATE OF BIRTH:  [birth year]   DATE OF ADMISSION:  09/02/2004  DATE OF DISCHARGE:                                HISTORY & PHYSICAL   CHIEF COMPLAINT:  Alcohol intoxication.   HISTORY OF PRESENT ILLNESS:  This patient is a 76 year old white male with a  history of intermittent alcohol abuse, who presented to the office after an  alcoholic binge lasting 12 days.  He had consumed Bourbon one hour prior to  his visit.  He was found to have slurred speech, droopy eyes, difficulty  standing and inability to walk.  He had fallen at home.  He was accompanied  by his wife.  He has been drinking on a daily basis and eating very little.  He was last hospitalized for alcohol-related problems last May.   PAST MEDICAL HISTORY:  1.  Diabetes.  2.  Hypertriglyceridemia.  3.  Hypertension.  4.  Non-Hodgkin lymphoma.  5.  Gout.  6.  Sleep apnea.  7.  Obesity.   MEDICATIONS:  1.  Atenolol 50 mg daily.  2.  Mirapex 0.125 mg daily.  3.  Flaxseed oil.  4.  Multivitamin.  5.  Garlic.  6.  Vitamin C.   ALLERGIES:  None.   SOCIAL HISTORY:  He has been a cigarette smoker and alcohol drinker off and  on for years.  He has retired from [employer].   FAMILY HISTORY:  Father had COPD.  His mother had pancreatic cancer.  His  sister had GYN cancer.   REVIEW OF SYSTEMS:  Noncontributory.   PHYSICAL EXAMINATION:  VITAL SIGNS:  Temperature 97.8, blood pressure  158/86, pulse 105, respirations 18, weight 218.  GENERAL:  Intoxicated-appearing, overweight white male.  HEENT:  He has droopy eyes.  He has slurred speech.  There is no scleral  icterus.  The pharynx is unremarkable.  NECK:  Supple without JVD or thyromegaly.  LUNGS:   Clear.  HEART:  Regular with no murmurs.  ABDOMEN:  Obese, nontender with no palpable organomegaly.  EXTREMITIES:  No cyanosis, clubbing or edema.  NEUROLOGIC:  He is barely able to stand.  He is not able to walk.  LYMPH NODES:  No enlargement.   LABORATORY DATA:  White count 6.1, hemoglobin 15.5, platelets 87,000.  Sodium 138, potassium 3.6, glucose 128, BUN 11, creatinine 0.8.  SGOT 51.  SGPT 33.  Calcium 8.2.  Magnesium 2.0.  Alcohol 242.   IMPRESSION:  1.  Alcohol intoxication.  He is being hospitalized for alcohol      detoxication.  He will be treated with thiamine, multivitamins,      magnesium, folate  and Ativan.  2.  Alcoholic hepatitis.  He has an abnormal SGOT which will be followed.  3.  Non-Hodgkin lymphoma.  He has a chronic thrombocytopenia.  He has had no      evidence of recurrent disease and is followed regularly by Dr. Truett Perna.  4.  Hypertension.  Continue atenolol.     ___________________________________________                                         Kingsley Callander. Ouida Sills, M.D.   ROF/MEDQ  D:  09/03/2004  T:  09/03/2004  Job:  [id number]

## 2011-05-08 NOTE — Cardiovascular Report (Signed)
NAME:  [name], SEBESTA NO.:  [id number]   MEDICAL RECORD NO.:  [id number]          PATIENT TYPE:  INP   LOCATION:  2915                         FACILITY:  [facility]   PHYSICIAN:  Charlies Constable, M.D. [facility] DATE OF BIRTH:  20-Nov-1935   DATE OF PROCEDURE:  07/22/2006  DATE OF DISCHARGE:                              CARDIAC CATHETERIZATION   CLINICAL COURSE:  Mr. Gallentine is 76 years old and has no prior history of  known heart disease although he does have a positive family history for  coronary heart disease. He has a history of atrial flutter in the past which  has been related to alcohol consumption. He also has a history of a lymphoma  which has been treated with chemotherapy by Dr. Truett Perna. Yesterday he  developed a syncopal episode and was referred by Dr. Ouida Sills for admission  here and was admitted yesterday by Dr. Eden Emms with EKGs and telemetry strip  showing intermittent complete heart block. He was scheduled for evaluation  and pacemaker today.   PROCEDURE:  Implantation of a Medtronic Adapta DDD pacemaker (model #ADDRL1,  serial [id number]) with a Medtronic bipolar screw in ventricular lead  (model #5076-58 cm, serial #[id number]) and a Medtronic bipolar screw in  atrial lead (model #5076-52 cm, serial #[id number]).   INDICATIONS FOR PROCEDURE:  Complete heart block and syncope.   ANESTHESIA:  1% with Xylocaine.   ESTIMATED BLOOD LOSS:  Less than 20 mL.   COMPLICATIONS:  None.   DESCRIPTION OF PROCEDURE:  The procedure was performed in laboratory room  #3. The left anterior chest was prepped and draped in the usual fashion. A  venogram was performed to aid in access. The subcutaneous tissue was  anesthetized with 1% local with Xylocaine. Using an 18-gauge needle, the  subclavian vein was entered and access was secured with a 0.038 wire. An  incision was made below the clavicle and extended to the prepectoral fascia.  A pocket was made inferior to the  incision using blunt dissection. Using two  9-French sheaths, the atrial and ventricular leads were passed to the right  atrium. The ventricular lead was passed to the right ventricular apex and  screwed into the right ventricular apex with good pacing parameters  described below. The atrium was screwed into the right atrial appendage with  good pacing parameters described below. After removal of the stylette, the  figure-of-eight suture that was placed at the entry site was secured. The  leads were attached to the posterior aspect of the pocket with 2 sutures of  1-0 silk around each Silastic collar. The pocket was irrigated with sterile  kanamycin solution. The leads were attached to the generator used the hex  nut wrench. The generator was implanted into the pocket and secured to the  posterior aspect of the pocket with one suture of 1-0 silk. The subcutaneous  tissue was closed with running 2-0 Vicryl. The skin was closed with running  4-0 Vicryl.   PACING PARAMETERS:  Right ventricle:  R wave 11.3 millivolts. __________ 0.8  volts at a pulse width of 0.5. Impedance 951 ohms.  Right atrium:  P wave 3.1 millivolts. __________ 1.0 volts at a pulse width  of 0.5. Impedance 555 ohms. There was no pacing on either lead at 10 volts.   The patient tolerated the procedure well and left the laboratory in  satisfactory condition. Will program to optimize intrinsic conduction as  much as possible.   ADDENDUM:  On fluoroscopy, the patient had moderately heavy calcification in  both the right and left coronary arteries. We may want to consider a stress  test to evaluate for ongoing ischemia and he should be treated as secondary  risk factor modification.           ______________________________  Charlies Constable, M.D. [facility]     BB/MEDQ  D:  07/22/2006  T:  07/22/2006  Job:  [id number]   cc:   Charlton Haws, M.D.

## 2011-05-08 NOTE — Discharge Summary (Signed)
NAMESHEAMUS, [name]               ACCOUNT NO.:  [id number]   MEDICAL RECORD NO.:  [id number]          PATIENT TYPE:  INP   LOCATION:  A201                          FACILITY:  [facility]   PHYSICIAN:  Kingsley Callander. Ouida Sills, MD       DATE OF BIRTH:  12-Dec-1935   DATE OF ADMISSION:  08/26/2005  DATE OF DISCHARGE:  09/08/2006LH                                 DISCHARGE SUMMARY   DISCHARGE DIAGNOSES:  1.  Atrial flutter.  2.  Alcohol abuse.  3.  Alcoholic hepatitis.  4.  Hypertension.  5.  Type 2 diabetes.  6.  History of non-Hodgkin's lymphoma.  7.  History of hyperuricemia/gout.  8.  Sleep apnea.   HOSPITAL COURSE:  This patient is a 76 year old, white male who presented  with a tachycardia in the 140 range.  He had been feeling shaky and had been  drinking again for about 6 days.  His EKG revealed atrial flutter.  He was  hospitalized in a monitored setting.  His rate slowed into the 80s and 90s  shortly after admission and he subsequently converted to normal sinus rhythm  within the first hospital day.  His EKG revealed a left bundle branch block.  Cardiac enzymes were negative.  He is treated chronically with Atenolol for  hypertension.  His glucose was 127.  He had evidence of alcoholic hepatitis  with an AST of 60 and ALT of 63.  His TSH was 3.1.  Echocardiogram has been  performed, but the result remains pending.   He was detoxed with Librium, thiamine, multivitamins and magnesium.  He  remained oriented.  He was able to walk well.  He was felt to be stable for  discharge on September 8.   FOLLOW UP:  He will be seen in followup in my office in 2 weeks.   SPECIAL INSTRUCTIONS:  He has been encouraged to abstain from all alcohol  use.   DISCHARGE MEDICATIONS:  1.  Librium 50 mg q.6h. p.r.n.  2.  Thiamine 100 mg daily x30 days.  3.  Multivitamin daily.  4.  Atenolol 50 mg daily.  5.  Ambien CR 6.25 mg nightly p.r.n.      Kingsley Callander. Ouida Sills, MD  Electronically Signed     ROF/MEDQ   D:  08/28/2005  T:  08/28/2005  Job:  [id number]

## 2011-05-08 NOTE — Discharge Summary (Signed)
NAME:  CHRISTINA, WALDROP                         ACCOUNT NO.:  [id number]   MEDICAL RECORD NO.:  [id number]                   PATIENT TYPE:  INP   LOCATION:  A218                                 FACILITY:  [facility]   PHYSICIAN:  Kingsley Callander. Ouida Sills, M.D.                  DATE OF BIRTH:  03-05-1935   DATE OF ADMISSION:  04/22/2004  DATE OF DISCHARGE:  04/23/2004                                 DISCHARGE SUMMARY   DISCHARGE DIAGNOSES:  1. Alcohol intoxication.  2. History of non-Hodgkin's lymphoma.  3. Pancytopenia.  4. Type 2 diabetes.  5. Hypertriglyceridemia.  6. Hypertension.   PROCEDURES:  Computed tomography scans of the neck, chest, abdomen and  pelvis.   HOSPITAL COURSE:  This patient is a 76 year old, white male who presented  initially to Dr. Truett Perna with left scapulae pain.  There was concern about  recurrence of lymphoma especially in light of his recent onset of B-type  symptoms again.  He was pancytopenic with a white count of 3.2, hemoglobin  12.7 and platelets of 87,000.  He had begun self-treating with alcohol.  He  was drinking bourbon daily.  He has a history of problems with alcohol.  He  was hospitalized and started on a detox regimen of thiamine, multivitamins,  magnesium, folate and Ativan.  He had no significant withdrawal symptoms.   His LDH was 145.  He underwent a CT scan of the neck, chest, abdomen and  pelvis which revealed no lymphadenopathy.  He did have an area of concentric  wall thickening in the sigmoid colon with multiple diverticula present.  Sigmoidoscopy was advised.  He has had a prior sigmoidoscopy in 1998, which  showed diverticulosis.  Additional arrangements for follow up of this  abnormality will be made when he returns as an outpatient.   It know appears unlikely that he has recurrent lymphoma.  He will have  additional evaluation with Dr. Truett Perna as an outpatient.   He was alert and fully oriented on the morning of May 4, and was felt to be  stable for discharge.  He will return to the office for followup in one  week.  We discussed inpatient alcohol detoxication [facility] a [facility] such [facility], but he did not prefer to seek this treatment now.  He does  commit to abstinence from alcohol now.  He will be treated with Ativan 1 mg  t.i.d. and thiamine 100 mg daily at home.  He will follow up in the office  in one week.   DISCHARGE MEDICATIONS:  1. Ativan 1 mg t.i.d.  2. Thiamine 100 mg q.d.  3. Atenolol 50 mg q.d.  4. Mirapex 0.125 mg b.i.d.  5. Tri-Chlor 160 mg q.d.     ___________________________________________  Kingsley Callander. Ouida Sills, M.D.   ROF/MEDQ  D:  04/23/2004  T:  04/24/2004  Job:  [id number]   cc:   Leighton Roach. Truett Perna, M.D.  [address]- [facility]  [place]  [place] [address]  Fax: [phone]

## 2011-05-08 NOTE — Assessment & Plan Note (Signed)
[facility]                           ELECTROPHYSIOLOGY OFFICE NOTE   NAME:Zilka, [name]                      MRN:          [id number]  DATE:08/11/2006                            DOB:          Jan 18, 1935    Mr. [name] is seen in the device clinic today, August 11, 2006, for  postoperative follow-up of his recently implanted Medtronic Adapta dual  chamber pacemaker.  Procedure done July 22, 2006, by Dr. Charlies Constable for  complete heart block and syncope.   Upon exam, site looks good without redness or swelling. Steri-Strips were  removed without incident.  Upon interrogation, battery voltage is 2.79  volts, in the atrium intrinsic amplitude is 4 mV with an impedance of 4.72  ohms and threshold of 0.75 volts at 0.4 msec.  In the ventricle, intrinsic  amplitude is 11.2 mV with an impedance of 5.16 ohms and threshold of 1 volt  at 0.4 msec.  No programming changes are made at this visit.  No programming  changes are made at this visit.  He will be seen by Dr. Juanda Chance for follow-up  on September 03, 2006, and be seen as needed following that.                                   Cleatrice Burke, RN                                Everardo Beals. Juanda Chance, MD, [facility]   CF/MedQ  DD:  08/11/2006  DT:  08/11/2006  Job #:  [id number]

## 2011-05-08 NOTE — Assessment & Plan Note (Signed)
[facility]                            CARDIOLOGY OFFICE NOTE   NAME:[name], [name]                      MRN:          [id number]  DATE:11/17/2006                            DOB:          06-18-1935    PRIMARY CARE PHYSICIAN:  Kingsley Callander. Ouida Sills, M.D.   CLINICAL HISTORY:  [name] is 76 years old and in August of this year  had a Medtronic Adapta DDD pacemaker implanted for complete AV block.  While we were putting the pacemaker in we noted marked palpitations on  fluoroscopy.  Subsequently had a myocardial scan which showed an  ejection fraction of 53%, a prior apical infarct with no ischemia.   He was seen in October by Tereso Newcomer and Geralynn Rile when his blood  pressure was elevated and he was put on Toprol-XL 50 a day which he had  previously been on.  Since that time he said his heart rate got somewhat  slow and so he cut back to taking the medicine about two times a week.  He did not describe any definite symptoms of fatigue or dizziness but  just had concern about his slow heart rate which was in the low 60s.   PAST MEDICAL HISTORY:  Significant for hyperlipidemia.   SOCIAL HISTORY:  He still has some intermittent smoking.  He has a  history of alcohol consumption.   CURRENT MEDICATIONS:  Include simvastatin, Prilosec, flax seed oil,  aspirin, Wellbutrin, and Toprol-XL.   EXAMINATION:  VITAL SIGNS:  The blood pressure was 120/68 and the pulse  62 and regular.  He indicated his blood pressures have sometimes been as  high as 160 at home.  NECK:  There is no venous distention.  The carotid pulses were full  without bruits.  CHEST:  Clear.  CARDIAC:  Rhythm was regular.  He had no murmurs or gallops.  The  patient's site was well healed.  ABDOMEN:  Soft with normal bowel sounds.  There is no  hepatosplenomegaly.  EXTREMITIES:  Peripheral pulses were full.  There was no peripheral  edema.   ECG showed sinus rhythm with left  bundle-branch block.   IMPRESSION:  1. Status post Medtronic DDD pacemaker for complete atrioventricular      block, stable.  2. Hypertension, probably not optimally controlled.  3. Coronary artery calcification with nonischemic Myoview scan but      with apical infarct.  4. Hyperlipidemia.  5. History of remote atrial flutter in the setting of alcohol      consumption.  6. History of alcohol use and alcoholic hepatitis.  7. Left bundle-branch block.   RECOMMENDATIONS:  I think [name] is doing well.  I think the  simplest thing is to try him on Toprol-XL 25 a day and he is willing to  try this.  I reassured him that heart rates in the 60s should be fine as  long as he is not having symptoms of dizziness or fatigue.  If he does  not tolerate this then we would consider a second drug.  He is scheduled  to see  Dr. Ouida Sills in January for a complete physical.  We will plan to  see him back on the year anniversary of his pacemaker which will be  August 2008.   ADDENDUM:  Interrogation of his pacemaker showed good thresholds on both  leads.  He is set in a long AV delay of 200 and it is pacing his  ventricle only 2% of the time and his atrium 23% of the time.  He is set  at adaptive AV capture so his outputs are determined by that.     Bruce Elvera Lennox Juanda Chance, MD, [facility]  Electronically Signed    BRB/MedQ  DD: 11/17/2006  DT: 11/17/2006  Job #: [id number]

## 2011-05-08 NOTE — Assessment & Plan Note (Signed)
[facility]                              CARDIOLOGY OFFICE NOTE   NAME:[name], [name]                      MRN:          [id number]  DATE:09/03/2006                            DOB:          [birth year]    PRIMARY CARE PHYSICIAN:  Dr. Carylon Perches   CLINICAL HISTORY:  [name] is 76 years old and was admitted on August 2  with syncope and complete heart block and underwent implantation of a  Medtronic Adapta DDD pacemaker.  He has done quite well since that time and  has had no recurrent syncope or palpitations.   While we put in the pacemaker, he had marked calcification noted on  fluoroscopy so we arranged for him to come back for a Myoview scan.   PAST MEDICAL HISTORY:  1. Previous lymphoma treated with chemotherapy.  2. He also has a history of alcohol consumption.  3. He also has a history of hyperlipidemia.   CURRENT MEDICATIONS:  Simvastatin, multivitamin, Prilosec, flaxseed oil and  aspirin.   PHYSICAL EXAMINATION:  VITAL SIGNS:  On examination today, his blood  pressure is 148/72 and the pulse 69 and regular.  NECK:  There was no venous distention.  The carotid pulses were full without  bruits.  CHEST:  Full.  CARDIAC:  Cardiac rhythm was regular.  He had no murmurs or gallops.  The  pacer site was well-healed.  ABDOMEN:  Soft without organomegaly.  EXTREMITIES:  Peripheral pulses were full.  There was no peripheral edema.   IMPRESSION:  1. Status post Medtronic Adapta DDD pacemaker for complete AV block.  2. Positive coronary artery calcification and a Myoview scan showing      apical scar with no ischemia, with an ejection fraction of 53%.  3. Hyperlipidemia.  4. History of remote atrial flutter in the setting of alcohol consumption.  5. History of alcohol use and alcoholic hepatitis.  6. History of hypertension.  7. Left bundle branch block.   An ECG showed normal sinus rhythm with left bundle branch block.   I think [name] is doing well.  We will plan to get an echo to confirm  his old anterior infarction and to reevaluate LV function and decide about  any other change in his treatment.  Otherwise,  we will continue his current medications.  He is on secondary prophylaxis  with simvastatin and aspirin.  We will plan to see him back in December and  adjust his thresholds at that time.                               Bruce Elvera Lennox Juanda Chance, MD, [facility]    BRB/MedQ  DD:  09/03/2006  DT:  09/04/2006  Job #:  [id number]

## 2011-05-08 NOTE — Op Note (Signed)
[facility]  Patient:    [name], [name] Visit Number: [id number] MRN: [id number]          Service Type: DSU Location: [facility] Attending Physician:  Susy Frizzle Dictated by:   Jeannett Senior Pollyann Kennedy, M.D. Proc. Date: 04/28/02 Admit Date:  04/28/2002 Discharge Date: 04/28/2002                             Operative Report  PREOPERATIVE DIAGNOSIS:  Right submandibular mass.  POSTOPERATIVE DIAGNOSIS:  Right submandibular mass.  PROCEDURE:  Excision of right submandibular gland and surrounding lymph nodes.  SURGEON:  Jefry H. Pollyann Kennedy, M.D.  ASSISTANT:  Kathy Breach, M.D.  ANESTHESIA:  General endotracheal anesthesia.  COMPLICATIONS:  None.  FINDINGS:  Severely enlarged and fibrotic facial lymph node just lateral to the right submandibular gland and medial to the marginal mandibular branch of the facial nerve.  A second enlarged lymph node just anterior to the facial vessels.  REFERRING PHYSICIAN:  Carylon Perches, M.D.  DISPOSITION:  The patient tolerated the procedure well, was awakened, extubated, and transferred to recovery in stable condition.  ESTIMATED BLOOD LOSS:  30 cc.  HISTORY:  This is a 76 year old gentleman who recently developed enlargement and firmness of the right submandibular area of the neck.  Needle aspiration biopsy revealed lymphocytes with the possibility for lymphoma.  Risks, benefits, and alternatives, complications of the procedure were explained to the patient, who seemed to understand and agreed to surgery.  DESCRIPTION OF PROCEDURE:  The patient was taken to the operating room and placed on the operating table in a supine position.  Following induction of general endotracheal anesthesia, the right side of the neck was prepped and draped in a standard fashion.  Electrocautery was used to incise the skin about 4 cm below the mandible on the right side.  Electrocautery was used to transect through the subcutaneous fat and the  platysma muscle.  A subplatysmal flap was elevated superiorly up to the mandible.  The marginal mandibular branch of the facial nerve was identified and dissected out off of the underlying inflammatory tissue.  The facial vessels were then identified and carefully ligated between clamps and divided.  The mylohyoid muscle was identified and retracted anteriorly.  The lingual nerve was identified, and the ganglion was divided between clamps and ligated.  The duct was ligated doubly as well.  The hypoglossal nerve was identified and preserved.  A large anterior lymph node was taken with the specimen.  The gland itself seemed to be normal, but the mass was a 5 cm lymph node just superficial to the gland. The posterior facial artery was ligated between clamps and divided.  The specimen was sent for pathologic evaluation fresh.  Lymphoma workup was requested.  The wound was irrigated with saline.  Hemostasis was double-checked, and the wound was closed in layers using 3-0 chromic on the platysmal layer, running 5-0 nylon on the skin.  A 7 [place] round JP drain was left in the wound, exited through the posterior aspect of the incision, and secured in place with a nylon suture.  Bacitracin ointment was applied.  The patient was then awakened, extubated, and transferred to recovery. Dictated by:   Jeannett Senior Pollyann Kennedy, M.D. Attending Physician:  Susy Frizzle DD:  04/28/02 TD:  05/01/02 Job: [id number]

## 2011-05-08 NOTE — Procedures (Signed)
[name], [name]               ACCOUNT NO.:  [id number]   MEDICAL RECORD NO.:  [id number]          PATIENT TYPE:  INP   LOCATION:  A201                          FACILITY:  [facility]   PHYSICIAN:  Jonelle Sidle, M.D. LHCDATE OF BIRTH:  04-01-1935   DATE OF PROCEDURE:  08/27/2005  DATE OF DISCHARGE:                                  ECHOCARDIOGRAM   INDICATIONS:  Atrial flutter (427.32).   A 2D ECHOCARDIOGRAM AND DOPPLER FINDINGS:  This is a technically adequate  study.  1.  Left ventricular wall thickness is mildly to moderately increased with      diastolic septal and posterior wall diameters approximately 16 and 13      mm, respectively.  I am showing a mild element of asymmetric septal      hypertrophy.  Overall chamber size is normal with a diastolic dimension      of 46 mm.  Left ventricular ejection fraction based on all views is in      the range of 40-50% with mild global hypokinesis except for some      paradoxical septal motion suggesting underlying conduction delay.  2.  The mitral inflow pattern and E:A ratio suggests grade 1 diastolic      dysfunction.  3.  Mild to moderate left atrial enlargement with a standard dimension of 46      mm.  4.  The mitral valve is mildly thickened, and the is mild mitral annular      calcification associated with trace mitral regurgitation.  5.  Aortic root size measures between 32-42 mm based on different views.      This suggests potentially mild aortic root dilatation, although, average      measurement is 37 mm.  6.  The aortic valve is trileaflet and mildly calcified with grossly      preserved cuspid excursion and no significant regurgitation.  7.  Grossly normal right ventricular chamber size and contraction.  8.  Trace to mild tricuspid regurgitation.  9.  Normal right atrial chamber size.  10. Possible trace pericardial effusion of no clear hemodynamic      significance.           ______________________________  Jonelle Sidle, M.D. [facility]     SGM/MEDQ  D:  08/27/2005  T:  08/28/2005  Job:  [id number]

## 2011-05-08 NOTE — H&P (Signed)
NAMECHARISTOPHER, RUMBLE NO.:  [id number]   MEDICAL RECORD NO.:  [id number]          PATIENT TYPE:  INP   LOCATION:  2915                         FACILITY:  [facility]   PHYSICIAN:  Charlton Haws, M.D.     DATE OF BIRTH:  1935/10/11   DATE OF ADMISSION:  07/21/2006  DATE OF DISCHARGE:                                HISTORY & PHYSICAL   PRIMARY CARE PHYSICIAN:  Dr. Carylon Perches   PRIMARY CARDIOLOGIST:  New   CHIEF COMPLAINT:  Syncope.   HISTORY OF PRESENT ILLNESS:  Mr. Cisek is a 76 year old white male with no  previous history of coronary artery disease.  He has a history of atrial  flutter in 2006.  He has had brief episodes of dizziness/presyncope  occasionally for years.  He was evaluated in September 2006 but he has been  found to have paroxysmal atrial fibrillation.  This resolved spontaneously  and he has done well on a low dose of atenolol.  Yesterday he had two  episodes of presyncope that occurred after he had gotten up and started  walking.  He stated that he stood still and the symptoms resolved  spontaneously and he kept going and everything was okay.   Today Mr. Pusey was in his usual state of health.  He got up this morning  and took his atenolol and drank two cups of coffee, but did not eat  anything.  When he went out to the car he remembers opening the car door and  woke up on the ground.  He had grandchildren near by that said he was only  gone for a couple of minutes.  When he came back in the house he was noted  to be pale and diaphoretic.  His family encouraged him to call 911 but he  refused and they took him to Dr. Alonza Smoker office.  Dr. Ouida Sills evaluated him  and noted him to be bradycardic with a heart rate in the 20s.  He arranged  for admission to [facility] where he is being evaluated by  cardiology.   Mr. Simenson has never had full syncope before.  He states that when he was in  Dr. Alonza Smoker office he would have episode dizziness,  diaphoresis, and pallor  and feel that he was going to pass out but he would drop his head down or  sit down and he would feel better.  The symptoms that he has had in the past  were never this severe and he never had this many of these episodes in one  day.  He has not had any chest pain and he denies shortness of breath.   PAST MEDICAL HISTORY:  1.  Status post echocardiogram in September of 2006 with an EF of 40-50%.  2.  Diabetes.  3.  Hypertension.  4.  Hyperlipidemia.  5.  Obesity.  6.  History of ETOH abuse.  7.  History of atrial flutter diagnosed in September 2006.  8.  History of pancytopenia after treatment for Hodgkin's lymphoma.  9.  Gout.  10. History of alcoholic hepatitis.  11. Restless leg syndrome.  12. History of depression.  13. History of Hodgkin's lymphoma.  14. History of obstructive sleep apnea.  15. Diverticulosis.   SURGICAL HISTORY:  1.  He has had a right submandibular mass removed.  2.  Colonoscopy.   ALLERGIES:  No known drug allergies.   MEDICATIONS:  1.  Atenolol 25 mg a day (did not take regularly).  2.  Flax seed oil.  3.  Multivitamin.  4.  Prilosec OTC daily.   SOCIAL HISTORY:  Lives in [place] with his wife and is retired from  [employer], [employer].  He has a greater than 50-pack-year history of ongoing tobacco  use.  He has had a history of ETOH abuse with binge drinking and his last  episode was in May of 2007.  He was hospitalized in October of 2006.   FAMILY HISTORY:  His mother died of cancer, but without a history of heart  disease.  His father had COPD and CHF.  He has no siblings with coronary  artery disease.   REVIEW OF SYSTEMS:  He has insomnia.  He has noted increased dyspnea on  exertion recently.  He has no history of palpitations even when he was in  atrial flutter.  The presyncope and syncope is described above.  He coughs  regularly and wheezes occasionally.  He has chronic arthralgias from a torn  rotator cuff.  He has  occasional reflux symptoms, but no hematemesis,  hemoptysis, or melena.  Review of systems is otherwise negative.   PHYSICAL EXAMINATION:  VITAL SIGNS:  Blood pressure 143/68, pulse 26,  respiratory rate 12, O2 saturation 91% on 2 L.  GENERAL:  He is a well-developed, obese white male in no acute distress.  HEENT:  His head is normocephalic, atraumatic.  His pupils equal, round, and  react to light and accommodation.  Extraocular movements intact.  Sclerae  clear.  Nares without discharge.  NECK:  There is no lymphadenopathy, thyromegaly, bruit, or JVD noted.  CARDIOVASCULAR:  His heart is slow, but regular in rate and rhythm with an  S1, S2 and no significant murmur, rub, or gallop is noted.  His distal  pulses are 2+ in all four extremities and no femoral bruits are appreciated.  LUNGS:  Have a few rales in the bases, but are essentially clear.  SKIN:  No rashes or lesions are noted.  ABDOMEN:  Soft and nontender with active bowel sounds.  He does not have  hepatosplenomegaly by palpation.  EXTREMITIES:  There is no clubbing, cyanosis, edema noted.  MUSCULOSKELETAL:  There is no joint deformity or effusions and no spine or  CVA tenderness.  NEUROLOGIC:  He is alert and oriented.  Cranial nerves II-XII grossly  intact.   Chest x-ray and laboratories are pending.   EKG has a heart rate of 25 beats per minute.  He has a left bundle branch  block and the rhythm is complete heart block.   IMPRESSION:  1.  Complete heart block.  He has had symptoms rarely in the past but he has      a history of paroxysmal atrial flutter without a history of      palpitations.  However, with his syncopal episode and a heart rate of 25      he will need a permanent pacemaker.  This has been scheduled.  He does      not need a temporary pacemaker as long as his level of consciousness and  blood pressure are normal. 2.  Hypertension:  Will hold his atenolol with the decision made to resume      it  once his pacemaker is inserted.  3.  Possible history of coronary artery disease:  He has multiple cardiac      risk factors and his ejection fraction was decreased slightly on      echocardiogram.  Will cycle enzymes and recheck an echocardiogram.      Cardiac catheterization may be indicated if his ejection fraction is      lower or if his cardiac enzymes are elevated.   Mr. Sharp is otherwise stable and will be continued on his home  medications.      Theodore Demark, P.A. [facility]    ______________________________  Charlton Haws, M.D.    RB/MEDQ  D:  07/21/2006  T:  07/21/2006  Job:  [id number]

## 2011-05-08 NOTE — Discharge Summary (Signed)
NAME:  [name], [name]                         ACCOUNT NO.:  [id number]   MEDICAL RECORD NO.:  [id number]                   PATIENT TYPE:  INP   LOCATION:  A228                                 FACILITY:  [facility]   PHYSICIAN:  Kingsley Callander. Ouida Sills, M.D.                  DATE OF BIRTH:  01-01-1935   DATE OF ADMISSION:  09/02/2004  DATE OF DISCHARGE:  09/04/2004                                 DISCHARGE SUMMARY   DISCHARGE DIAGNOSES:  1.  Alcohol intoxication.  2.  Chronic alcohol abuse.  3.  Probable underlying depression.  4.  Impaired fasting glucose.  5.  Hypertension.  6.  Non-Hodgkin lymphoma.  7.  Gout.  8.  Sleep apnea.  9.  Obesity.  10. Hypertriglyceridemia.  11. Alcoholic hepatitis.   HOSPITAL COURSE:  This patient is a 76 year old white male who presented  initially to my office after a drinking binge.  He appeared intoxicated.  He  had recently been eating poorly.  He had been drinking bourbon regularly.  He had become unable to walk.  His speech was slurred.  He was hospitalized.  He was treated with a detoxification regimen including thiamine,  multivitamins, magnesium, folate, Ativan and atenolol.  He improved.  He  became able to walk well.  A Behavioral Medicine consultation was obtained.  It was felt that he likely suffers underlying depression.  He declined  transfer for alcohol rehabilitation.  He then left against medical advice  prior to being given any instructions by me.  I was notified of his  departure after he had left.   His AST was elevated at 51.  His initial alcohol level was 242.      ROF/MEDQ  D:  09/05/2004  T:  09/05/2004  Job:  [id number]

## 2011-05-08 NOTE — Discharge Summary (Signed)
NAMEDEREC, MOZINGO NO.:  [id number]   MEDICAL RECORD NO.:  [id number]          PATIENT TYPE:  INP   LOCATION:  2915                         FACILITY:  [facility]   PHYSICIAN:  Charlton Haws, M.D.     DATE OF BIRTH:  10/05/1935   DATE OF ADMISSION:  07/21/2006  DATE OF DISCHARGE:                                 DISCHARGE SUMMARY   DICTATED FOR:  Charlton Haws, M.D.   DISCHARGE DATE:  July 23, 2006   ALLERGIES:  This patient has no known drug allergies.   PRINCIPAL DIAGNOSIS:  1.  Discharge day 1 status post of Medtronic Adapta dual chamber pacemaker.  2.  Admitted with 48 hours of presyncope and then one frank syncopal      episode, this is secondary to complete heart block.  3.  Heavy Calcification of the coronaries on fluoroscopic study at implant      of pacemaker.      1.  Add aspirin and statin.      2.  Post recovery Myoview in 1 month.      3.  Echocardiogram July 21, 2006, ejection fraction 50 to 55%.  4.  History of atrial flutter September 2006 in the setting of ethanol      intoxication.   SECONDARY DIAGNOSES:  1.  History of alcohol abuse/alcoholic hepatitis.  2.  Hypertension.  3.  History of lymphoma, now in remission.  4.  Left bundle branch block.  5.  History of left rotator cuff tear.   PROCEDURE:  July 22, 2006, implantation of Medtronic Adapta dual chamber  pacemaker, Dr. Charlies Constable.   Postprocedure chest x-ray shows leads are appropriate.  No pneumothorax.  Pacemaker has been interrogated and is functioning within normal limits.  No  changes were made at interrogation.  Patient's incision was healing nicely,  there was no hematoma.   BIRTH HISTORY:  Mr. Chiara is a 76 year old male.  He has no previous  history of coronary artery disease.  He does have a history atrial flutter  in 2006.  He was intoxicated at the time.  He has had brief episodes of  dizziness, presyncope occasionally for years.  He was evaluated in 2006 and  was found to have paroxysmal atrial fibrillation.  This has resolved  spontaneously and he has done well on a low dose of atenolol.   Yesterday had 2 episodes of presyncope that occurred after he had gotten up  and started walking.  He stated that he stood still and the symptoms  resolved spontaneously, and the kept going.   On August 1, Mr. Cullens was in his usual state of health.  He got up in the  morning, took his atenolol, drank 2 cups of coffee but did not eat.  He went  to his car, he remembers opening the car door to place a blanket so that he  would protect his seat [place].  He remembers  nothing else and woke up on the ground.  He had grandchildren nearby that  said he was only out  for a couple minutes.  When he came back to the house  he was noted to be pale and diaphoretic.  Family encouraged him to call 911  but he refused.  They took him to Dr. Alonza Smoker office.  At Dr. Alonza Smoker office  an electrocardiogram showed bradycardia with heart rate in the 20s.  He has  now arranged for transfer to [facility].   Mr. Saraceno has never had full syncope before. He states that when he was in  Dr. Alonza Smoker office he would have episodic dizziness, diaphoresis, and pallor  and felt that he would pass out, but he would drop his head down and sit  down and he would feel better.  The symptoms he has had in the past were  never this severe and he had never had so many symptoms in one day.  He has  not had any chest pain and he denies shortness of breath.   HOSPITAL COURSE:  Mr. Damore presents to [facility] August 1, a  transfer from Dr. Alonza Smoker office.  He was found on telemetry to be in  complete heart block with bradycardia.  He is mildly symptomatic, gets  dyspnea on exertion on this admission.  Troponin I studies were performed.  They were 0.02, then 0.02, then 0.03.  He is being evaluated for  implantation of dual chamber pacemaker.  This was  done on August 2, although  the patient spent the night of August 1 with transcutaneous pacers applied,  but they were not used. The patient tolerated the procedure well on August  2, and as mentioned above, the incision presents with no hematoma.  There  was noted heavy calcifications of the coronaries on fluoroscopy during the  procedure, and Dr. Juanda Chance adds aspirin and a statin to his medical regimen.  He will also go home on multivitamin daily and Prilosec over-the-counter  daily.  Once again, chest x-ray shows his leads in appropriate position.  There is no pneumothorax, and the device has been interrogated.  Mobility to  the left arm has been discussed with the patient as well as extensive  followup.  Pacer Clinic followup is as follows:  Pacer Clinic Wednesday  August 22 at 9:20.  He will see Dr. Juanda Chance in November 2007, and Dr.  Regino Schultze office will call with that appointment.  Mr.  Marcy has a stress  test ordered.  This is an exercise Myoview.  This is scheduled for Monday,  September 10 at 8:45.  He is asked not to eat anything after midnight Sunday  September 9.  He is to follow up with Dr. Juanda Chance Friday September 14, at  10:45 to evaluate the stress study.   Once again, discharge medications:  1.  Percocet 5/325, 1 to 2 tabs every 4 to 6 hours as needed for pain.  2.  Multivitamin daily.  3.  Prilosec over the counter daily.  4.  Flaxseed oil daily.  5.  Simvastatin 40 mg daily at bedtime.  6.  Enteric-coated aspirin 81 mg daily.  The patient will hold on atenolol until he sees Dr. Juanda Chance.   LABORATORY STUDIES THIS ADMISSION:  On August 2, sodium 140, potassium 4.3,  chloride 107, carbonate 28, BUN is 19, creatinine 1.2, and glucose is 105.  Complete blood count this admission:  Hemoglobin 15.1, hematocrit 43.3,  white cell 8.3, platelets 153.  The TSH this admission was 3.337, HgBA1c is  5.3.     Maple Mirza, P.A.  ______________________________  Charlton Haws, M.D.    GM/MEDQ  D:  07/23/2006  T:  07/23/2006  Job:  [id number]   cc:   Kingsley Callander. Ouida Sills, MD  Charlies Constable, M.D. [facility]

## 2011-09-01 ENCOUNTER — Other Ambulatory Visit: Payer: Self-pay | Admitting: Oncology

## 2011-09-01 ENCOUNTER — Encounter (HOSPITAL_BASED_OUTPATIENT_CLINIC_OR_DEPARTMENT_OTHER): Payer: Medicare Other | Admitting: Oncology

## 2011-09-01 DIAGNOSIS — D7589 Other specified diseases of blood and blood-forming organs: Secondary | ICD-10-CM

## 2011-09-01 DIAGNOSIS — D696 Thrombocytopenia, unspecified: Secondary | ICD-10-CM

## 2011-09-01 DIAGNOSIS — C8589 Other specified types of non-Hodgkin lymphoma, extranodal and solid organ sites: Secondary | ICD-10-CM

## 2011-09-01 LAB — CBC WITH DIFFERENTIAL/PLATELET
BASO%: 0.2 % (ref 0.0–2.0)
EOS%: 3.1 % (ref 0.0–7.0)
Eosinophils Absolute: 0.2 10*3/uL (ref 0.0–0.5)
LYMPH%: 15.5 % (ref 14.0–49.0)
MCH: 35.8 pg — ABNORMAL HIGH (ref 27.2–33.4)
MCHC: 35.1 g/dL (ref 32.0–36.0)
MCV: 102.1 fL — ABNORMAL HIGH (ref 79.3–98.0)
MONO%: 8 % (ref 0.0–14.0)
Platelets: 151 10*3/uL (ref 140–400)
RBC: 3.37 10*6/uL — ABNORMAL LOW (ref 4.20–5.82)
RDW: 13.9 % (ref 11.0–14.6)

## 2011-10-02 ENCOUNTER — Ambulatory Visit (INDEPENDENT_AMBULATORY_CARE_PROVIDER_SITE_OTHER): Payer: Medicare Other | Admitting: Internal Medicine

## 2011-10-02 ENCOUNTER — Encounter: Payer: Self-pay | Admitting: Internal Medicine

## 2011-10-02 ENCOUNTER — Encounter: Payer: Self-pay | Admitting: *Deleted

## 2011-10-02 DIAGNOSIS — I2510 Atherosclerotic heart disease of native coronary artery without angina pectoris: Secondary | ICD-10-CM

## 2011-10-02 DIAGNOSIS — I442 Atrioventricular block, complete: Secondary | ICD-10-CM

## 2011-10-02 DIAGNOSIS — I10 Essential (primary) hypertension: Secondary | ICD-10-CM

## 2011-10-02 LAB — PACEMAKER DEVICE OBSERVATION
AL AMPLITUDE: 2.8 mv
AL IMPEDENCE PM: 481 Ohm
ATRIAL PACING PM: 63
DEVICE MODEL PM: 240667
RV LEAD IMPEDENCE PM: 505 Ohm
RV LEAD THRESHOLD: 0.625 V

## 2011-10-02 NOTE — Assessment & Plan Note (Signed)
Stable No change required today  
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
## 2011-10-02 NOTE — Assessment & Plan Note (Signed)
Normal pacemaker function See Pace Art report No changes today  

## 2011-10-02 NOTE — Patient Instructions (Signed)
Your physician wants you to follow-up in: 12 months with Dr Allred You will receive a reminder letter in the mail two months in advance. If you don't receive a letter, please call our office to schedule the follow-up appointment.  

## 2011-11-16 ENCOUNTER — Other Ambulatory Visit: Payer: Self-pay

## 2011-11-16 ENCOUNTER — Emergency Department (HOSPITAL_COMMUNITY): Payer: Medicare Other

## 2011-11-16 ENCOUNTER — Emergency Department (HOSPITAL_COMMUNITY)
Admission: EM | Admit: 2011-11-16 | Discharge: 2011-11-16 | Disposition: A | Discharge status: Other Acute Inpatient Hospital | Payer: Medicare Other | Attending: Emergency Medicine | Admitting: Emergency Medicine

## 2011-11-16 ENCOUNTER — Encounter (HOSPITAL_COMMUNITY): Payer: Self-pay | Admitting: *Deleted

## 2011-11-16 DIAGNOSIS — I10 Essential (primary) hypertension: Secondary | ICD-10-CM | POA: Insufficient documentation

## 2011-11-16 DIAGNOSIS — T07XXXA Unspecified multiple injuries, initial encounter: Secondary | ICD-10-CM | POA: Insufficient documentation

## 2011-11-16 DIAGNOSIS — F10929 Alcohol use, unspecified with intoxication, unspecified: Secondary | ICD-10-CM

## 2011-11-16 DIAGNOSIS — M25559 Pain in unspecified hip: Secondary | ICD-10-CM | POA: Insufficient documentation

## 2011-11-16 DIAGNOSIS — R45851 Suicidal ideations: Secondary | ICD-10-CM | POA: Insufficient documentation

## 2011-11-16 DIAGNOSIS — M542 Cervicalgia: Secondary | ICD-10-CM | POA: Insufficient documentation

## 2011-11-16 DIAGNOSIS — F1010 Alcohol abuse, uncomplicated: Secondary | ICD-10-CM | POA: Insufficient documentation

## 2011-11-16 DIAGNOSIS — E785 Hyperlipidemia, unspecified: Secondary | ICD-10-CM | POA: Insufficient documentation

## 2011-11-16 DIAGNOSIS — R51 Headache: Secondary | ICD-10-CM | POA: Insufficient documentation

## 2011-11-16 DIAGNOSIS — R079 Chest pain, unspecified: Secondary | ICD-10-CM | POA: Insufficient documentation

## 2011-11-16 LAB — BASIC METABOLIC PANEL
BUN: 13 mg/dL (ref 6–23)
Calcium: 10.7 mg/dL — ABNORMAL HIGH (ref 8.4–10.5)
GFR calc non Af Amer: 83 mL/min — ABNORMAL LOW (ref >90)
Glucose, Bld: 93 mg/dL (ref 70–99)
Sodium: 145 mEq/L (ref 135–145)

## 2011-11-16 LAB — RAPID URINE DRUG SCREEN, HOSP PERFORMED
Amphetamines: NOT DETECTED
Barbiturates: NOT DETECTED
Tetrahydrocannabinol: NOT DETECTED

## 2011-11-16 LAB — URINALYSIS, ROUTINE W REFLEX MICROSCOPIC
Glucose, UA: NEGATIVE mg/dL
Hgb urine dipstick: NEGATIVE
Ketones, ur: NEGATIVE mg/dL
Protein, ur: NEGATIVE mg/dL

## 2011-11-16 LAB — CBC
Hemoglobin: 14.6 g/dL (ref 13.0–17.0)
MCH: 33.8 pg (ref 26.0–34.0)
MCHC: 33.7 g/dL (ref 30.0–36.0)

## 2011-11-16 LAB — CARDIAC PANEL(CRET KIN+CKTOT+MB+TROPI): Troponin I: <0.3 ng/mL (ref <0.30)

## 2011-11-16 MED ORDER — MORPHINE SULFATE 4 MG/ML IJ SOLN
4.0000 mg | Freq: Once | INTRAMUSCULAR | Status: AC
  Administered 2011-11-16: 4 mg via INTRAVENOUS
  Filled 2011-11-16: qty 1

## 2011-11-16 NOTE — ED Notes (Signed)
Pt transported to c-scan. Family left to obtain lunch.

## 2011-11-16 NOTE — ED Notes (Signed)
Pt becoming anxious asking for a sleep aid or a vicidan. Pt takes vicidan on a daily basis.

## 2011-11-16 NOTE — BH Assessment (Signed)
11/16/11@2130   PT ACCEPTED TO [facility] BY DR Dan Humphreys TO DR Rexene Edison [id number].  DR Lorre Nick AGREES WITH DISPOSITION.  SEE SUPPORT PAPERWORK.
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
## 2011-11-16 NOTE — ED Notes (Signed)
Engineer, maintenance (IT) at bedside talking to pt. Pt consented to blood draw, blood drawn per protocol.   Pt tolerated well.

## 2011-11-16 NOTE — ED Notes (Signed)
Pt's family at bedside. Pt cursing at wife. Pt easily calmed by RN.  Pt given lunch tray.  C-collar remains on at this point in time, pending c-scan.

## 2011-11-16 NOTE — ED Notes (Signed)
Pt's watch, belt, shirt and pants placed in belongings bag and placed per policy.  Pt's clothing had been cut off him per EMS.
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
## 2011-11-16 NOTE — ED Notes (Signed)
Pt is restless at this time pacing the floor.  Pt asking for a sleep aid.  Dr Freida Busman aware and declined request at this time.
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
## 2011-11-17 ENCOUNTER — Encounter (HOSPITAL_COMMUNITY): Payer: Self-pay | Admitting: *Deleted

## 2011-11-17 ENCOUNTER — Inpatient Hospital Stay (HOSPITAL_COMMUNITY)
Admission: RE | Admit: 2011-11-17 | Discharge: 2011-11-21 | DRG: 897 | Disposition: A | Discharge status: Home / Self Care | Payer: Medicare Other | Source: Ambulatory Visit | Attending: Psychiatry | Admitting: Psychiatry

## 2011-11-17 DIAGNOSIS — I2510 Atherosclerotic heart disease of native coronary artery without angina pectoris: Secondary | ICD-10-CM

## 2011-11-17 DIAGNOSIS — Z950 Presence of cardiac pacemaker: Secondary | ICD-10-CM

## 2011-11-17 DIAGNOSIS — F1120 Opioid dependence, uncomplicated: Secondary | ICD-10-CM

## 2011-11-17 DIAGNOSIS — X58XXXA Exposure to other specified factors, initial encounter: Secondary | ICD-10-CM

## 2011-11-17 DIAGNOSIS — M25519 Pain in unspecified shoulder: Secondary | ICD-10-CM

## 2011-11-17 DIAGNOSIS — I4892 Unspecified atrial flutter: Secondary | ICD-10-CM

## 2011-11-17 DIAGNOSIS — E785 Hyperlipidemia, unspecified: Secondary | ICD-10-CM

## 2011-11-17 DIAGNOSIS — F339 Major depressive disorder, recurrent, unspecified: Secondary | ICD-10-CM

## 2011-11-17 DIAGNOSIS — R279 Unspecified lack of coordination: Secondary | ICD-10-CM

## 2011-11-17 DIAGNOSIS — IMO0002 Reserved for concepts with insufficient information to code with codable children: Secondary | ICD-10-CM

## 2011-11-17 DIAGNOSIS — I10 Essential (primary) hypertension: Secondary | ICD-10-CM

## 2011-11-17 DIAGNOSIS — G8929 Other chronic pain: Secondary | ICD-10-CM

## 2011-11-17 DIAGNOSIS — Z79899 Other long term (current) drug therapy: Secondary | ICD-10-CM

## 2011-11-17 DIAGNOSIS — F332 Major depressive disorder, recurrent severe without psychotic features: Secondary | ICD-10-CM

## 2011-11-17 DIAGNOSIS — Z7982 Long term (current) use of aspirin: Secondary | ICD-10-CM

## 2011-11-17 DIAGNOSIS — F1020 Alcohol dependence, uncomplicated: Principal | ICD-10-CM

## 2011-11-17 DIAGNOSIS — Z87891 Personal history of nicotine dependence: Secondary | ICD-10-CM

## 2011-11-17 MED ORDER — NICOTINE POLACRILEX 2 MG MT GUM
2.0000 mg | CHEWING_GUM | OROMUCOSAL | Status: DC | PRN
  Administered 2011-11-18 – 2011-11-21 (×6): 2 mg via ORAL
  Filled 2011-11-17: qty 5

## 2011-11-17 MED ORDER — VITAMIN B-1 100 MG PO TABS: 100.0000 mg | ORAL_TABLET | Freq: Every day | ORAL | Status: DC

## 2011-11-17 MED ORDER — CHLORDIAZEPOXIDE HCL 25 MG PO CAPS
25.0000 mg | ORAL_CAPSULE | Freq: Four times a day (QID) | ORAL | Status: AC
  Administered 2011-11-17 (×3): 25 mg via ORAL
  Filled 2011-11-17 (×2): qty 1

## 2011-11-17 MED ORDER — VITAMIN B-1 100 MG PO TABS
100.0000 mg | ORAL_TABLET | Freq: Every day | ORAL | Status: DC
  Administered 2011-11-18 – 2011-11-21 (×4): 100 mg via ORAL
  Filled 2011-11-17 (×6): qty 1

## 2011-11-17 MED ORDER — ACETAMINOPHEN 325 MG PO TABS
650.0000 mg | ORAL_TABLET | Freq: Four times a day (QID) | ORAL | Status: DC | PRN
  Administered 2011-11-19 – 2011-11-21 (×2): 650 mg via ORAL

## 2011-11-17 MED ORDER — HYDROXYZINE PAMOATE 25 MG PO CAPS: 25.0000 mg | ORAL_CAPSULE | Freq: Four times a day (QID) | ORAL | Status: DC | PRN

## 2011-11-17 MED ORDER — PANTOPRAZOLE SODIUM 40 MG PO TBEC
40.0000 mg | DELAYED_RELEASE_TABLET | Freq: Every day | ORAL | Status: DC
  Administered 2011-11-17 – 2011-11-21 (×5): 40 mg via ORAL
  Filled 2011-11-17 (×6): qty 1

## 2011-11-17 MED ORDER — SIMVASTATIN 40 MG PO TABS
40.0000 mg | ORAL_TABLET | Freq: Every day | ORAL | Status: DC
  Administered 2011-11-17 – 2011-11-20 (×4): 40 mg via ORAL
  Filled 2011-11-17 (×6): qty 1

## 2011-11-17 MED ORDER — ONDANSETRON 4 MG PO TBDP: 4.0000 mg | ORAL_TABLET | Freq: Four times a day (QID) | ORAL | Status: AC | PRN

## 2011-11-17 MED ORDER — CHLORDIAZEPOXIDE HCL 25 MG PO CAPS
25.0000 mg | ORAL_CAPSULE | Freq: Three times a day (TID) | ORAL | Status: AC
  Administered 2011-11-18 (×3): 25 mg via ORAL
  Filled 2011-11-17 (×3): qty 1

## 2011-11-17 MED ORDER — LOPERAMIDE HCL 2 MG PO CAPS: 2.0000 mg | ORAL_CAPSULE | ORAL | Status: AC | PRN

## 2011-11-17 MED ORDER — TRAZODONE HCL 50 MG PO TABS
50.0000 mg | ORAL_TABLET | Freq: Every evening | ORAL | Status: DC | PRN
  Administered 2011-11-18 – 2011-11-21 (×4): 50 mg via ORAL
  Filled 2011-11-17 (×4): qty 1

## 2011-11-17 MED ORDER — MAGNESIUM HYDROXIDE 400 MG/5ML PO SUSP: 30.0000 mL | Freq: Every day | ORAL | Status: DC | PRN

## 2011-11-17 MED ORDER — THERA M PLUS PO TABS
1.0000 | ORAL_TABLET | Freq: Every day | ORAL | Status: DC
  Administered 2011-11-17 – 2011-11-21 (×5): 1 via ORAL
  Filled 2011-11-17 (×6): qty 1

## 2011-11-17 MED ORDER — IBUPROFEN 200 MG PO TABS: 400.0000 mg | ORAL_TABLET | Freq: Four times a day (QID) | ORAL | Status: DC | PRN

## 2011-11-17 MED ORDER — CHLORDIAZEPOXIDE HCL 25 MG PO CAPS
25.0000 mg | ORAL_CAPSULE | ORAL | Status: AC
  Administered 2011-11-19 (×2): 25 mg via ORAL
  Filled 2011-11-17 (×2): qty 1

## 2011-11-17 MED ORDER — THERA M PLUS PO TABS: 1.0000 | ORAL_TABLET | Freq: Every day | ORAL | Status: DC

## 2011-11-17 MED ORDER — ALUM & MAG HYDROXIDE-SIMETH 200-200-20 MG/5ML PO SUSP: 30.0000 mL | ORAL | Status: DC | PRN

## 2011-11-17 MED ORDER — CHLORDIAZEPOXIDE HCL 25 MG PO CAPS
25.0000 mg | ORAL_CAPSULE | Freq: Every day | ORAL | Status: AC
  Administered 2011-11-20: 25 mg via ORAL
  Filled 2011-11-17: qty 1

## 2011-11-17 MED ORDER — CHLORDIAZEPOXIDE HCL 25 MG PO CAPS
25.0000 mg | ORAL_CAPSULE | Freq: Four times a day (QID) | ORAL | Status: DC | PRN
  Administered 2011-11-17: 25 mg via ORAL
  Filled 2011-11-17: qty 1

## 2011-11-17 MED ORDER — ASPIRIN EC 81 MG PO TBEC
81.0000 mg | DELAYED_RELEASE_TABLET | Freq: Every day | ORAL | Status: DC
  Administered 2011-11-17 – 2011-11-21 (×5): 81 mg via ORAL
  Filled 2011-11-17 (×6): qty 1

## 2011-11-17 MED ORDER — CHLORDIAZEPOXIDE HCL 25 MG PO CAPS: 25.0000 mg | ORAL_CAPSULE | Freq: Four times a day (QID) | ORAL | Status: DC | PRN

## 2011-11-17 MED ORDER — ACETAMINOPHEN 325 MG PO TABS: 650.0000 mg | ORAL_TABLET | Freq: Four times a day (QID) | ORAL | Status: DC | PRN

## 2011-11-17 MED ORDER — THIAMINE HCL 100 MG/ML IJ SOLN: 100.0000 mg | Freq: Once | INTRAMUSCULAR | Status: DC

## 2011-11-17 MED ORDER — NICOTINE POLACRILEX 2 MG MT GUM
2.0000 mg | CHEWING_GUM | OROMUCOSAL | Status: DC
  Administered 2011-11-17 – 2011-11-21 (×17): 2 mg via ORAL
  Filled 2011-11-17 (×30): qty 1

## 2011-11-17 MED ORDER — LOPERAMIDE HCL 2 MG PO CAPS: 2.0000 mg | ORAL_CAPSULE | ORAL | Status: DC | PRN

## 2011-11-17 MED ORDER — ONDANSETRON 4 MG PO TBDP: 4.0000 mg | ORAL_TABLET | Freq: Four times a day (QID) | ORAL | Status: DC | PRN

## 2011-11-17 MED ORDER — HYDROXYZINE HCL 25 MG PO TABS: 25.0000 mg | ORAL_TABLET | Freq: Four times a day (QID) | ORAL | Status: DC | PRN

## 2011-11-17 MED ORDER — HYDROCODONE-ACETAMINOPHEN 10-325 MG PO TABS
1.0000 | ORAL_TABLET | Freq: Once | ORAL | Status: AC
  Administered 2011-11-17: 1 via ORAL
  Filled 2011-11-17: qty 1

## 2011-11-17 NOTE — Tx Team (Signed)
Initial Interdisciplinary Treatment Plan  PATIENT STRENGTHS: (choose at least two) Communication skills General fund of knowledge Religious Affiliation Supportive family/friends Work skills  PATIENT STRESSORS: Marital or family conflict Substance abuse   PROBLEM LIST: Problem List/Patient Goals Date to be addressed Date deferred Reason deferred Estimated date of resolution  Substance abuse - ETOH 11/17/11     PMH: pacemaker, high cholesterol, HTN 11/17/11                 Goal - D/C in about 3 days          - stay sober                               DISCHARGE CRITERIA:  Ability to meet basic life and health needs Improved stabilization in mood, thinking, and/or behavior Motivation to continue treatment in a less acute level of care Reduction of life-threatening or endangering symptoms to within safe limits Verbal commitment to aftercare and medication compliance Withdrawal symptoms are absent or subacute and managed without 24-hour nursing intervention  PRELIMINARY DISCHARGE PLAN: Participate in family therapy Return to previous living arrangement  PATIENT/FAMIILY INVOLVEMENT: This treatment plan has been presented to and reviewed with the patient, [name], and/or family member.  The patient and family have been given the opportunity to ask questions and make suggestions.  [name] 11/17/2011, 4:01 AM

## 2011-11-17 NOTE — Progress Notes (Signed)
[facility] Adult Inpatient Family/Significant Other Suicide Prevention Education  Suicide Prevention Education:  Education Completed; Tkai Serfass, wife, [phone] has been identified by the patient as the family member/significant other with whom the patient will be residing, and identified as the person(s) who will aid the patient in the event of a mental health crisis (suicidal ideations/suicide attempt).  With written consent from the patient, the family member/significant other has been provided the following suicide prevention education, prior to the and/or following the discharge of the patient.  The suicide prevention education provided includes the following:  Suicide risk factors  Suicide prevention and interventions  National Suicide Hotline telephone number  [facility] assessment telephone number  Emergency Assistance 911  [facility] and/or Residential Mobile Crisis Unit telephone number  Request made of family/significant other to:  Remove weapons (e.g., guns, rifles, knives), all items previously/currently identified as safety concern.  Wife reports she will see that pt's firearms are removed from home before discharge and secured in another location other than the [place].  Remove drugs/medications (over-the-counter, prescriptions, illicit drugs), all items previously/currently identified as a safety concern. Pt's wife reports that pt just recently refilled Vicodin prescription. Lucendia Herrlich was hesitant to discard without confirmation that Dr here knows it is prescribed by Dr Benay Pike at [facility].   The family member/significant other verbalizes understanding of the suicide prevention education information provided.  The family member/significant other agrees to remove the items of safety concern listed above.  Clide Dales 11/17/2011, 3:49 PM

## 2011-11-17 NOTE — Progress Notes (Signed)
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  Stop opiates and substitute non-controlled pain meds.  Monitor for effectiveness  Additional comments:  Estimated length of stay:3-4 days  Discharge Plan:Unclear  New goal(s): N/A  Review of initial/current patient goals per problem list:   1.  Goal(s):Safely detox from alcohol, benzos  Met:  No  Target date:11/30  As evidenced AV:[id number] in CIWA score from current to 0  2.  Goal (s):Return pt home as soon as possible  Met:  No  Target date:ASAP  As evidenced YN:WGNFAO will return home as soon as safely detoxed, unless he decides to go into rehab  3.  Goal(s):  Met:  No  Target date:  As evidenced by:  4.  Goal(s):  Met:  No  Target date:  As evidenced by:  Attendees: Patient:     Family:     Physician:  Lupe Carney 11/17/2011 8:14 AM   Nursing:   Roswell Miners 11/17/2011 8:14 AM   Case Manager:  Richelle Ito, LCSW 11/17/2011 8:14 AM   Counselor:   Ronda Fairly, LCSWA 11/17/2011 8:14 AM   Other:     Other:     Other:     Other:      Scribe for Treatment Team:   Ida Rogue, 11/17/2011 8:14 AM
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
## 2011-11-17 NOTE — Progress Notes (Signed)
Recreation Therapy Group Note  Date: 11/17/2011         Time: 1415      Group Topic/Focus: The focus of this group is on enhancing patients' problem solving skills, which involves identifying the problem, brainstorming solutions and choosing and trying a solution.   Participation Level: Active  Participation Quality: Attentive  Affect: Irritable   Cognitive: Oriented   Additional Comments: Patient expressed frustration that he was in group when most of his peers were in bed. Patient believes that if his wife says its okay and he swears on a stack on Bibles he won't drink he'll be discharged tomorrow.   Jaidev Sanger 11/17/2011 4:01 PM
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
## 2011-11-17 NOTE — Progress Notes (Signed)
Recreation Therapy Group Note  Date: 11/17/2011         Time: 1000       Group Topic/Focus: Patient invited to participate in animal assisted therapy. Pets as a coping skill and responsibility were discussed.   Participation Level: Active  Participation Quality: Sharing  Affect: Appropriate  Cognitive: Oriented   Additional Comments: Patient spoke about competing with his dog in an obedience show several years ago
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
## 2011-11-17 NOTE — Progress Notes (Signed)
Patient ID: [name], male   DOB: 15-Aug-1935, 76 y.o.   MRN: [id number] He has been up and about and to groups today interacting with peers and staff. Has not requested any prn medications but did voice his concern if he was going to get medication  Tonight.  He did not  Fill out his self inventory today.

## 2011-11-18 NOTE — Progress Notes (Signed)
[facility] Group Notes:  (Counselor/Nursing/MHT/Case Management/Adjunct)  11/18/2011 3:56 PM  Type of Therapy:  Group Therapy  Participation Level:  Minimal  Participation Quality:  Drowsy and Resistant  Affect:  Flat  Cognitive:  Oriented  Insight:  Limited  Engagement in Group:  None  Engagement in Therapy:  None  Modes of Intervention:  Clarification, Orientation and Socialization  Summary of Progress/Problems: Group members were asked to share something about themselves that others would not necessarily know right away, ie an interest they have & their successes/enjoyment received from it.Patient showed no interest in what others shared and spent majority of time sleeping. 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 Pt expressed anxiety as to when he will be released; stating "this is costing me a lot; I need to get out of here asap"   Clide Dales 11/18/2011, 3:56 PM

## 2011-11-18 NOTE — Progress Notes (Signed)
Pt seen in AM group.  Again states he is ready to go, though did refer to AA mtg last night as a helpful reminder of how one must keep sobriety focus in forefront.  States he does not want to go to rehab, but will consider IOP.  Asked for a referral to one in [place] as that is only 10 minutes away.  He attends AA mtgs there.  They are at the same church twice a day 365 days a year.
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
## 2011-11-18 NOTE — Progress Notes (Signed)
[facility] MD Progress Note  11/18/2011 2:58 PM  Diagnosis:  Alcohol intoxication                      Hx of alcohol dependence with recent relapse.  ADL's:  Intact  Sleep:  Yes,  [id number] report Appetite:  Yes,  AEB:  Suicidal Ideation:   Plan:  No  Intent:  No  Means:  No  Homicidal Ideation:   Plan:  No  Intent:  No  Means:  No  AEB (as evidenced by):Pt. Vehemently denies any suicide attempt.  Mental Status: General Appearance [name]:  Neat cooperative  initially he stood very close to me and attempted to read my unit census, then attempted to hug me. Eye Contact:  Good Motor Behavior:  Normal Speech:  Normal Level of Consciousness:  Alert Mood:  Anxious Affect:  Appropriate Anxiety Level:  Minimal Thought Process:  Coherent but circumstantial Thought Content:  WNL Perception:  Normal Judgment:  Fair Insight:  Present Cognition:  Orientation time but slow to respond Sleep:  Number of Hours: 5.75   Vital Signs:Blood pressure 146/84, pulse 99, temperature 97.4 F (36.3 C), temperature source Oral, resp. rate 18, height 5\' 8"  (1.727 m), weight 177 lb (80.287 kg).  Lab Results: No results found for this or any previous visit (from the past 48 hour(s)).  Physical Findings:   AIMS:  , ,  ,  ,    CIWA:  CIWA-Ar Total: 0  COWS:     Treatment Plan Summary: Daily contact with patient to assess and evaluate symptoms and progress in treatment  Plan: Further evaluation for age related mental decline vs. Alcoholic dementia.            R/O Benzodiazepine abuse. Will continue current plan of care with possible D/C in 24 hours if continues to improve.  [name] 11/18/2011, 2:58 PM

## 2011-11-18 NOTE — Progress Notes (Signed)
Pt is a pleasant and polite 76 yr old that attends groups and interacts with others well. Pt only reports difficulty falling sleep. A prn dose of trazodone was effective for this patients insomnia. Pt safety remains with q25min checks. Support and availability as needed has been extended to this patient.

## 2011-11-18 NOTE — Progress Notes (Signed)
Patient ID: [name], male   DOB: August 09, 1935, 76 y.o.   MRN: [id number] Nursing Pt is pleasant and cooperative.He is med compliant and attending all Groups. He regrets his Drinking again and states he will beat it again.Encouraged and supported.

## 2011-11-18 NOTE — Progress Notes (Signed)
[facility] Group Notes:  (Counselor/Nursing/MHT/Case Management/Adjunct)  11/18/2011 3:48 PM  Type of Therapy:  Group Therapy  Participation Level:  Minimal  Participation Quality:  Drowsy and Inattentive  Affect:  Flat  Cognitive:  Oriented  Insight:  Limited  Engagement in Group:  Limited  Engagement in Therapy:  Limited  Modes of Intervention:  Clarification, Socialization and Support  Summary of Progress/Problems: Focus of group, emotional regulation, introduced in context of the family rules which one grew up with. [name] was visually attentive yet quiet and unengageable  Clide Dales 11/18/2011, 3:48 PM

## 2011-11-19 DIAGNOSIS — F1120 Opioid dependence, uncomplicated: Secondary | ICD-10-CM

## 2011-11-19 DIAGNOSIS — F332 Major depressive disorder, recurrent severe without psychotic features: Secondary | ICD-10-CM

## 2011-11-19 DIAGNOSIS — F1020 Alcohol dependence, uncomplicated: Principal | ICD-10-CM
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
## 2011-11-19 NOTE — Progress Notes (Signed)
Patient ID: [name], male   DOB: 1935-09-07, 76 y.o.   MRN: [id number] Pt. Eyes closed, resp. Even no distrass notedl.

## 2011-11-19 NOTE — Progress Notes (Signed)
Pt was seen while waiting to go down for recreation and stopped writer to complain about no discharge and order for an MRI. Pt was allowed to vent anger yet continues to report he does not need to be here, to the point of wanting to engage in argument. Pt was asked to consider the order might be in his best interest.

## 2011-11-19 NOTE — Progress Notes (Signed)
[facility] Group Notes:  (Counselor/Nursing/MHT/Case Management/Adjunct)  11/19/2011 4:01 PM  Type of Therapy:  Processing Group at 11am  Participation Level:  Active  Participation Quality:  Intrusive, Resistant and Sharing  Affect:  Anxious and Irritable  Cognitive:  Confused  Insight:  Limited  Engagement in Group:  Limited  Engagement in Therapy:  Limited  Modes of Intervention:  Activity, Clarification, Limit-setting and Socialization  Summary of Progress/Problems:Group topic of 'balance in life' was introduced; patients contributed to list of things needed in order to achieve balance in life.  Patients were asked to choose two cards from a deck of photographs, one to represent what 'life in balance' feels like for them and another to represent what their life feels like when out of balance. Patient choose two cards representing life in balance (bird in hand and path in woods) and said he did not wish to choose anything else. Pt was asked to chose again something to represent out of balance and choose a photo of a person stuck in a locker and a person's head covered in sticky notes. Pt continues to report he does not need to be here to the point of wanting to engage in argument.  Clide Dales 11/19/2011, 4:01 PM

## 2011-11-19 NOTE — Progress Notes (Signed)
Spoke to wife and patient together via phone.  Outlined recommendations to wife of rehab, which pt had rejected.  Then spoke of IOP which patient has agreed to engage in.  She is agreement with that recommendation, and is willing to transport patient.  She also spoke of friends in [place] who will transport pt to those mtgs.  She is not willing to go to [facility] mtgs.  Stated she has gone in past [10 yrs ago] and it made here depressed.  Became tearful as she was talking about it,and revealed that her father had been alcoholic as well. She made it clear that she feels all his current problems/symptoms stem from the use of opiates, of which he increased consumption of dramatically in the last 2 weeks.  He became belligerent, angry, threatening, mean spirited and ridiculing to all family members.  She was relieved to hear we were not giving him opiates, and that we are contacting his doctors to let them know same.  He initially asked her to keep his meds for him, but at my insistence she agreed to dispose of them.  She also confirmed that guns have been removed from the home.  Wife OK with d/c whenever Dr feels he is stable for d/c.

## 2011-11-20 MED ORDER — HYDROXYZINE HCL 25 MG PO TABS
25.0000 mg | ORAL_TABLET | Freq: Once | ORAL | Status: AC | PRN
  Administered 2011-11-20: 25 mg via ORAL
  Filled 2011-11-20: qty 1

## 2011-11-20 MED ORDER — HYDROXYZINE HCL 25 MG PO TABS: 25.0000 mg | ORAL_TABLET | Freq: Once | ORAL | Status: DC

## 2011-11-20 MED ORDER — HYDROXYZINE HCL 25 MG PO TABS
25.0000 mg | ORAL_TABLET | Freq: Every evening | ORAL | Status: AC | PRN
  Administered 2011-11-20: 25 mg via ORAL
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
## 2011-11-20 NOTE — Progress Notes (Signed)
Patient ID: BAY WAYSON, male   DOB: 1935-11-28, 76 y.o.   MRN: [id number]  Subjective:  Today Mr. Prohaska requested D/C from the hospital.  He reports that he had slept well and that his appetite was good.  He attended groups and stated that he did indeed want to go home.  He was reporting no problems and did dose off occasionally in the group. He reported no pain in his shoulder during rounds.  Objective:T.98.5 Pulse 83  Resp 16 and Bp 141/75 Mr. Wiedemann's Orthopedist Dr. Benay Pike was informed of Mr. Weigold successful discontinuation of his hydrocodone and was pleased to hear the patient's good news.  Mr. Congrove is being treated with Ibuprofen and is doing very well.  Dr. Ouida Sills was also informed of Mr. Pettry successful detox and also was pleased to hear that he was doing well.  Dr. Ouida Sills will follow Mr. Joss upon his discharge and will not use benzodiazepines or narcotic pain medication.  Mr. Diodato was evaluated for discharge later in the afternoon.  He had just woken up from a nap.  During the conversation, his speech was clear, coherent and goal directed.  He again denied SI/HI and stated that he was ready to go home.  Unfortunately Mr. Balogh began displaying unusual upper body movements.  He seemed to be shifting in his seat to get comfortable.  This was continuous.  When he was distracted he began to move abnormally again.  He had no facial assymetry, no change in speech, or imbalance while walking.    A.) New onset dyskensia of uncertain etiology. P,) Neurology consult was ordered.   CT of head is ordered. No plan for discharge at this time, but will consider D/C tomorrow AM if cleared by neurologist.

## 2011-11-20 NOTE — Progress Notes (Signed)
[facility] Group Notes:  (Counselor/Nursing/MHT/Case Management/Adjunct)  11/20/2011 3:30 PM  Type of Therapy:  1:15pm  Participation Level:  None  Participation Quality:  Patient was asleep before the topic was introduced   Clide Dales 11/20/2011, 3:30 PM

## 2011-11-20 NOTE — Progress Notes (Signed)
[facility] MD Progress Note  11/20/2011 2:14 PM  Clinical Factors: Alcohol Dependence; Benzodiazepine & Opioid Withdrawal resolved; r/o Cognitive Disorder NOS; r/o Mild Cognitive Decline; r/o Movement Disorder   Subjective: Patient seen and evaluated. Chart reviewed. Patient stated that he wanted to go home. In treatment team today it was discussed that Ms. Heckler was willing to drive him to IOP at [facility], as well as bring him home. We also talked with his outpatient providers and discussed the importance of discontinuing/not restarting benzodiazepines and opiates in light of Mr. Depass alcoholism and drug overuse. Ms. Devine indicated that the narcotics significantly worsened his difficulties in the last few weeks and that those medications were a large component of his decompensation.  Discussed with Mr. Kornegay.  We were in the process of potentially discharging Mr. Mcnutt today when it was noted that he was having significant involuntary upper body movements. The movements were still present with distraction and they were not notable upon admission. Mr. Skalla was seen by both myself and the physician assistant and we placed a neurology consult today. They'll be seeing the patient within the next 24 hours and we have also ordered a CT scan.  Current Mental Status: He reports his mood to be "fine". His affect was appropriate, and euthymic until we said that we needed to get a neurology consult.  He then became very irritable and agitated because he wanted to go home now.  Nevertheless, his speech was normal rate, tone and volume. His eye contact was good. He was dressed and active on the unit. He denied any current thoughts of self injurious behavior, suicidal ideation or homicidal ideation. He denied any auditory or visual hallucinations, paranoia or delusional thought processes. His insight is limited and judgment is poor.   Sleep:  Number of Hours: 3.75   Vital Signs:Blood pressure 141/75,  pulse 83, temperature 98.5 F (36.9 C), temperature source Oral, resp. rate 16, height 5\' 8"  (1.727 m), weight 80.287 kg (177 lb).  Lab Results:  Results for orders placed during the hospital encounter of 11/17/11 (from the past 48 hour(s))  VITAMIN B12     Status: Normal   Collection Time   11/19/11  8:05 PM      Component Value Range Comment   Vitamin B-12 685  211 - 911 (pg/mL)   FOLATE     Status: Normal   Collection Time   11/19/11  8:05 PM      Component Value Range Comment   Folate 17.8      Plan: Continue inpatient treatment with q. 15 minute checks per protocol. Patient is not in need of one-on-one level of observation at this time. He contracted for safety on the inpatient unit and is looking forward to going home soon. He agreed previously to detox off of clonazepam and utilize ibuprofen for pain management. Please see orders. Medication education provided.  Pt may be cleared to be discharged home once neurology consult and CT are completed, if stable.  Discussed with team.  Lupe Carney 11/20/2011, 2:14 PM

## 2011-11-20 NOTE — Progress Notes (Signed)
As pt is scheduled for discharge on Saturday 11/21/11 pt's wife requested discharge by 1:00PM as she has an afternoon meeting at her home which is located [place] hour outside [place]  Pt's wife, Lucendia Herrlich confirmed that new Vicodin prescription has been disposed of and all firearms have been removed from home.

## 2011-11-20 NOTE — Progress Notes (Signed)
Pt attended AM group.  Mood good.  Insight fair.  Optimistic about going home today after call with wife yesterday.  Wife confirmed that she had gotten rid of all firearms and disposed of pain meds.  Pt to follow up at IOP at [facility].  Likely d/c today, tomorrow at latest.

## 2011-11-20 NOTE — Progress Notes (Signed)
[facility] Group Notes:  (Counselor/Nursing/MHT/Case Management/Adjunct)  11/20/2011 3:22 PM  Type of Therapy:  Group processing at 11:00  Participation Level:  Active  Participation Quality:  Attentive, Monopolizing and Sharing  Affect:  Irritable and tearful  Cognitive:  Appropriate  Insight:  Good  Engagement in Group:  Good  Engagement in Therapy:  Good  Modes of Intervention:  Exploration  Summary of Progress/Problems:n Pt processed thoughts and feelings surrounding relapse. He shared he get fed up and will blame others prior to relapse, but that now he has accepted that it happened and is grateful for another chance. [name] provided suggestions for others but was somewhat monopolizing of the group and appeared irritable. Group facilitated by doctorial intern CM    Clide Dales 11/20/2011, 3:22 PM

## 2011-11-20 NOTE — Progress Notes (Signed)
Pt was in bed at time of group, but neurologist came to consult on him as ordered.  Pt states that neurologist did a test on him and stated that he found nothing wrong.  Pt quite upset about not being discharged yesterday and states that neurologist told him that he is to have a CT scan at 0800 in AM and needs to be over there by that time.  Pt anxious to discharge tomorrow.  No further complaints voiced.  Denies SI/HI/hallucinations.  Will continue to monitor.

## 2011-11-21 ENCOUNTER — Inpatient Hospital Stay (HOSPITAL_COMMUNITY)
Admission: RE | Admit: 2011-11-21 | Discharge: 2011-11-21 | Disposition: A | Discharge status: Home / Self Care | Payer: Medicare Other | Source: Ambulatory Visit | Attending: Physician Assistant | Admitting: Physician Assistant

## 2011-11-21 MED ORDER — IBUPROFEN 400 MG PO TABS: 400.0000 mg | ORAL_TABLET | Freq: Four times a day (QID) | ORAL | Status: AC | PRN

## 2011-11-21 MED ORDER — TRAZODONE HCL 50 MG PO TABS: 50.0000 mg | ORAL_TABLET | Freq: Every evening | ORAL | Status: DC | PRN

## 2011-11-21 NOTE — Progress Notes (Signed)
Pt anxious for d/c. Denies s/s of etoh withdrawal and no outward signs observed. Denies SI/HI. Verbalized understanding of all d/c intsructions. All belongings returned and escorted to lobby to care of wife.
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
## 2011-11-21 NOTE — Progress Notes (Signed)
Suicide Risk Assessment  Discharge Assessment     Demographic factors:  Assessment Details Time of Assessment: Admission Information Obtained From: Patient Current Mental Status:  Current Mental Status:  (Denies) Risk Reduction Factors:  Risk Reduction Factors: Sense of responsibility to family;Religious beliefs about death;Living with another person, especially a relative;Positive social support  CLINICAL FACTORS:   Alcohol/Substance Abuse/Dependencies  COGNITIVE FEATURES THAT CONTRIBUTE TO RISK:  None  SUICIDE RISK:   Minimal: No identifiable suicidal ideation.  Patients presenting with no risk factors but with morbid ruminations; may be classified as minimal risk based on the severity of the depressive symptoms  PLAN OF CARE: I reviewed medical records as per treatment team it was decided that patient is safe to go home yesterday but neurology consult was pending so it is done today and as patient mental status is stable he is logical and goal-directed not suicidal or homicidal not having any psychotic or manic symptoms and his wife has also been contacted and she confirmed that the firearms and other things which are dangerous towards the patient has been taken care of, patient can be discharged to follow up in the intensive outpatient program.  Dinita Migliaccio S 11/21/2011, 1:43 PM

## 2011-11-21 NOTE — Progress Notes (Signed)
[facility] Group Notes:  (Counselor/Nursing/MHT/Case Management/Adjunct)  11/21/2011 1315PM  Type of Therapy:  Psychoeducational Skills  Participation Level:  Active  Participation Quality:  Appropriate and Resistant  Affect:  Angry and Appropriate  Cognitive:  Appropriate  Insight:  Good  Engagement in Group:  Good  Engagement in Therapy:  Good  Modes of Intervention:  Clarification, Problem-solving and Support  Summary of Progress/Problems: Pt. chose the color red to describe his feelings today. Pt. stated that he would choose "the deepest devil red color" to describe his anger. Pt. stated that he is upset with staff and that he would like to go home. Pt. committed to try to let the situation work itself out as a way to increase self-care.   [place], Desiree 11/21/2011, 3:51 PM

## 2011-11-21 NOTE — Progress Notes (Signed)
[facility] Case Management Discharge Plan:  Will you be returning to the same living situation after discharge: Yes,  home Would you like a referral for services when you are discharged:Yes,   Do you have access to transportation at discharge:Yes,   Do you have the ability to pay for your medications:Yes,    Interagency Information:     Patient to Follow up at:   Follow-up Information    Follow up with [facility] on 11/24/2011. (Tuesday 12/4 at 3:00pm with Dr. Maisie Fus)    Contact information:   [address] [place], [place] [address] [phone]         Patient denies SI/HI:   Yes,      Safety Planning and Suicide Prevention discussed:  Yes,    Barrier to discharge identified:No.  Summary and Recommendations: pt cleared for D/C. Denies any and all S/I and or H/I   [facility] 11/21/2011, 3:01 PM

## 2011-11-24 NOTE — Progress Notes (Signed)
Patient Discharge Instructions:  No consents for follow up  Reece Agar, 11/24/2011, 3:03 PM

## 2011-12-01 ENCOUNTER — Encounter (HOSPITAL_COMMUNITY): Payer: Self-pay

## 2011-12-01 ENCOUNTER — Emergency Department (HOSPITAL_COMMUNITY)
Admission: EM | Admit: 2011-12-01 | Discharge: 2011-12-01 | Disposition: A | Discharge status: Home / Self Care | Payer: Medicare Other | Attending: Emergency Medicine | Admitting: Emergency Medicine

## 2011-12-01 DIAGNOSIS — C8589 Other specified types of non-Hodgkin lymphoma, extranodal and solid organ sites: Secondary | ICD-10-CM | POA: Insufficient documentation

## 2011-12-01 DIAGNOSIS — I10 Essential (primary) hypertension: Secondary | ICD-10-CM | POA: Insufficient documentation

## 2011-12-01 DIAGNOSIS — F19939 Other psychoactive substance use, unspecified with withdrawal, unspecified: Secondary | ICD-10-CM

## 2011-12-01 DIAGNOSIS — R42 Dizziness and giddiness: Secondary | ICD-10-CM | POA: Insufficient documentation

## 2011-12-01 DIAGNOSIS — F1120 Opioid dependence, uncomplicated: Secondary | ICD-10-CM | POA: Insufficient documentation

## 2011-12-01 DIAGNOSIS — F19239 Other psychoactive substance dependence with withdrawal, unspecified: Secondary | ICD-10-CM

## 2011-12-01 DIAGNOSIS — F411 Generalized anxiety disorder: Secondary | ICD-10-CM | POA: Insufficient documentation

## 2011-12-01 DIAGNOSIS — Z7982 Long term (current) use of aspirin: Secondary | ICD-10-CM | POA: Insufficient documentation

## 2011-12-01 DIAGNOSIS — R259 Unspecified abnormal involuntary movements: Secondary | ICD-10-CM | POA: Insufficient documentation

## 2011-12-01 DIAGNOSIS — R5383 Other fatigue: Secondary | ICD-10-CM | POA: Insufficient documentation

## 2011-12-01 DIAGNOSIS — R630 Anorexia: Secondary | ICD-10-CM | POA: Insufficient documentation

## 2011-12-01 DIAGNOSIS — Z79899 Other long term (current) drug therapy: Secondary | ICD-10-CM | POA: Insufficient documentation

## 2011-12-01 DIAGNOSIS — Z950 Presence of cardiac pacemaker: Secondary | ICD-10-CM | POA: Insufficient documentation

## 2011-12-01 DIAGNOSIS — R5381 Other malaise: Secondary | ICD-10-CM | POA: Insufficient documentation

## 2011-12-01 DIAGNOSIS — E785 Hyperlipidemia, unspecified: Secondary | ICD-10-CM | POA: Insufficient documentation

## 2011-12-01 DIAGNOSIS — R450 Nervousness: Secondary | ICD-10-CM

## 2011-12-01 DIAGNOSIS — I2510 Atherosclerotic heart disease of native coronary artery without angina pectoris: Secondary | ICD-10-CM | POA: Insufficient documentation

## 2011-12-01 LAB — CBC
HCT: 35.9 % — ABNORMAL LOW (ref 39.0–52.0)
Hemoglobin: 12.7 g/dL — ABNORMAL LOW (ref 13.0–17.0)
MCH: 35 pg — ABNORMAL HIGH (ref 26.0–34.0)
MCHC: 35.4 g/dL (ref 30.0–36.0)
MCV: 98.9 fL (ref 78.0–100.0)
Platelets: 167 10*3/uL (ref 150–400)
RBC: 3.63 MIL/uL — ABNORMAL LOW (ref 4.22–5.81)
RDW: 13.9 % (ref 11.5–15.5)
WBC: 5.9 10*3/uL (ref 4.0–10.5)

## 2011-12-01 LAB — URINALYSIS, ROUTINE W REFLEX MICROSCOPIC
Bilirubin Urine: NEGATIVE
Glucose, UA: NEGATIVE mg/dL
Hgb urine dipstick: NEGATIVE
Ketones, ur: NEGATIVE mg/dL
Leukocytes, UA: NEGATIVE
Nitrite: NEGATIVE
Protein, ur: NEGATIVE mg/dL
Specific Gravity, Urine: <1.005 — ABNORMAL LOW (ref 1.005–1.030)
Urobilinogen, UA: 0.2 mg/dL (ref 0.0–1.0)
pH: 5.5 (ref 5.0–8.0)

## 2011-12-01 LAB — TROPONIN I: Troponin I: <0.3 ng/mL (ref <0.30)

## 2011-12-01 LAB — BASIC METABOLIC PANEL
BUN: 14 mg/dL (ref 6–23)
CO2: 26 mEq/L (ref 19–32)
Calcium: 10.4 mg/dL (ref 8.4–10.5)
Chloride: 101 mEq/L (ref 96–112)
Creatinine, Ser: 1.13 mg/dL (ref 0.50–1.35)
GFR calc Af Amer: 72 mL/min — ABNORMAL LOW (ref >90)
GFR calc non Af Amer: 62 mL/min — ABNORMAL LOW (ref >90)
Glucose, Bld: 77 mg/dL (ref 70–99)
Potassium: 3.8 mEq/L (ref 3.5–5.1)
Sodium: 136 mEq/L (ref 135–145)

## 2011-12-01 MED ORDER — DIAZEPAM 5 MG PO TABS: 5.0000 mg | ORAL_TABLET | Freq: Two times a day (BID) | ORAL | Status: AC | PRN

## 2011-12-01 MED ORDER — DIAZEPAM 5 MG/ML IJ SOLN
5.0000 mg | Freq: Once | INTRAMUSCULAR | Status: AC
  Administered 2011-12-01: 5 mg via INTRAVENOUS
  Filled 2011-12-01: qty 2

## 2011-12-01 MED ORDER — SODIUM CHLORIDE 0.9 % IV BOLUS (SEPSIS)
1000.0000 mL | Freq: Once | INTRAVENOUS | Status: AC
  Administered 2011-12-01: 1000 mL via INTRAVENOUS
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
## 2011-12-01 NOTE — ED Notes (Signed)
Denies pain  States he is agitated - shakey and nervous at present
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
## 2011-12-01 NOTE — ED Notes (Signed)
Pt reports Dr Ouida Sills referring him here to have his hgb and hematocrit checked.  Pt reports fatigue, dizziness, and decreased appetite for the past 5 days.  Pt has had to have a blood transfusion in the past.

## 2011-12-31 ENCOUNTER — Encounter: Payer: Medicare Other | Admitting: *Deleted

## 2012-01-04 ENCOUNTER — Encounter: Payer: Self-pay | Admitting: *Deleted

## 2012-05-16 ENCOUNTER — Encounter: Payer: Self-pay | Admitting: *Deleted

## 2012-05-25 ENCOUNTER — Ambulatory Visit (INDEPENDENT_AMBULATORY_CARE_PROVIDER_SITE_OTHER): Payer: No Typology Code available for payment source | Admitting: *Deleted

## 2012-05-25 ENCOUNTER — Telehealth: Payer: Self-pay | Admitting: Oncology

## 2012-05-25 DIAGNOSIS — I442 Atrioventricular block, complete: Secondary | ICD-10-CM

## 2012-05-25 NOTE — Telephone Encounter (Signed)
S/w the pt and he is aware of his sept 2013 appts

## 2012-05-27 ENCOUNTER — Telehealth: Payer: Self-pay | Admitting: Internal Medicine

## 2012-05-27 NOTE — Telephone Encounter (Signed)
Please return call to patient at [phone] concerning remote transmission.  Patient tried to transmit on 05/25/12.

## 2012-05-27 NOTE — Telephone Encounter (Signed)
Spoke w/pt and pt aware that transmission was received. Pt also aware that letter will be sent with next transmission date or OV.

## 2012-08-25 ENCOUNTER — Telehealth: Payer: Self-pay | Admitting: Oncology

## 2012-08-25 NOTE — Telephone Encounter (Signed)
S/W pt about appt change per Dr. Truett Perna 9/12 to 9/23 @ 9:30. New calendar mailed.

## 2012-09-01 ENCOUNTER — Ambulatory Visit: Payer: No Typology Code available for payment source | Admitting: Oncology

## 2012-09-01 ENCOUNTER — Other Ambulatory Visit: Payer: No Typology Code available for payment source | Admitting: Lab

## 2012-09-12 ENCOUNTER — Telehealth: Payer: Self-pay | Admitting: Oncology

## 2012-09-12 ENCOUNTER — Ambulatory Visit (HOSPITAL_BASED_OUTPATIENT_CLINIC_OR_DEPARTMENT_OTHER): Payer: Medicare Other | Admitting: Oncology

## 2012-09-12 ENCOUNTER — Other Ambulatory Visit (HOSPITAL_BASED_OUTPATIENT_CLINIC_OR_DEPARTMENT_OTHER): Payer: Medicare Other | Admitting: Lab

## 2012-09-12 VITALS — BP 139/75 | HR 74 | Temp 98.5°F | Resp 18 | Ht 67.5 in | Wt 190.2 lb

## 2012-09-12 DIAGNOSIS — C8589 Other specified types of non-Hodgkin lymphoma, extranodal and solid organ sites: Secondary | ICD-10-CM

## 2012-09-12 DIAGNOSIS — D696 Thrombocytopenia, unspecified: Secondary | ICD-10-CM

## 2012-09-12 DIAGNOSIS — D539 Nutritional anemia, unspecified: Secondary | ICD-10-CM

## 2012-09-12 LAB — CBC WITH DIFFERENTIAL/PLATELET
Eosinophils Absolute: 0.2 10*3/uL (ref 0.0–0.5)
HCT: 37.3 % — ABNORMAL LOW (ref 38.4–49.9)
LYMPH%: 15.1 % (ref 14.0–49.0)
MONO#: 0.6 10*3/uL (ref 0.1–0.9)
NEUT#: 3.6 10*3/uL (ref 1.5–6.5)
NEUT%: 69.5 % (ref 39.0–75.0)
Platelets: 130 10*3/uL — ABNORMAL LOW (ref 140–400)
WBC: 5.2 10*3/uL (ref 4.0–10.3)

## 2012-09-12 NOTE — Telephone Encounter (Signed)
gve the pt his sept 2014 appt calendar

## 2012-09-12 NOTE — Progress Notes (Signed)
    [facility]    OFFICE PROGRESS NOTE   INTERVAL HISTORY:   He returns as scheduled. He feels well. No fever, night sweats, anorexia, or palpable lymph nodes. No pain.  Objective:  Vital signs in last 24 hours:  Blood pressure 139/75, pulse 74, temperature 98.5 F (36.9 C), temperature source Oral, resp. rate 18, height 5' 7.5" (1.715 m), weight 190 lb 3.2 oz (86.274 kg).    HEENT: Neck without mass Lymphatics: No cervical, supraclavicular, axillary, or inguinal nodes Resp: Lungs clear bilaterally Cardio: Regular rate and rhythm GI: No hepatomegaly, no mass, nontender Vascular: No leg edema   Lab Results:  Lab Results  Component Value Date   WBC 5.2 09/12/2012   HGB 13.0 09/12/2012   HCT 37.3* 09/12/2012   MCV 98.4* 09/12/2012   PLT 130* 09/12/2012   ANC 3.6    Medications: I have reviewed the patient's current medications.  Assessment/Plan: 1. 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  He remains in clinical remission. 2. Mild macrocytic anemia, ? related to history of alcohol use or bone marrow toxicity from chemotherapy, stable. 3. History of chronic mild thrombocytopenia. Stable 4. Status post pacemaker placement   Disposition:  He remains in clinical remission from non-Hodgkin's lymphoma. Mr. Droll would like to continue followup at the [facility]. He will return for an office visit in one year.   Thornton Papas, MD  09/12/2012  2:17 PM

## 2012-10-05 ENCOUNTER — Encounter: Payer: Self-pay | Admitting: *Deleted

## 2012-10-05 DIAGNOSIS — Z950 Presence of cardiac pacemaker: Secondary | ICD-10-CM | POA: Insufficient documentation

## 2012-10-13 ENCOUNTER — Ambulatory Visit (INDEPENDENT_AMBULATORY_CARE_PROVIDER_SITE_OTHER): Payer: Medicare Other | Admitting: Internal Medicine

## 2012-10-13 ENCOUNTER — Encounter: Payer: Self-pay | Admitting: Internal Medicine

## 2012-10-13 VITALS — BP 120/68 | HR 77 | Ht 68.0 in | Wt 189.0 lb

## 2012-10-13 DIAGNOSIS — I442 Atrioventricular block, complete: Secondary | ICD-10-CM

## 2012-10-13 DIAGNOSIS — Z950 Presence of cardiac pacemaker: Secondary | ICD-10-CM

## 2012-10-13 LAB — PACEMAKER DEVICE OBSERVATION
AL THRESHOLD: 0.5 V
BATTERY VOLTAGE: 2.79 V
VENTRICULAR PACING PM: 99.9
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
## 2012-10-13 NOTE — Patient Instructions (Signed)
Remote monitoring is used to monitor your Pacemaker of ICD from home. This monitoring reduces the number of office visits required to check your device to one time per year. It allows us to keep an eye on the functioning of your device to ensure it is working properly. You are scheduled for a device check from home on January 16, 2013. You may send your transmission at any time that day. If you have a wireless device, the transmission will be sent automatically. After your physician reviews your transmission, you will receive a postcard with your next transmission date.  Your physician wants you to follow-up in: 1 year with Dr Allred.  You will receive a reminder letter in the mail two months in advance. If you don't receive a letter, please call our office to schedule the follow-up appointment.  

## 2013-01-16 ENCOUNTER — Ambulatory Visit (INDEPENDENT_AMBULATORY_CARE_PROVIDER_SITE_OTHER): Payer: Medicare Other | Admitting: *Deleted

## 2013-01-16 DIAGNOSIS — Z950 Presence of cardiac pacemaker: Secondary | ICD-10-CM

## 2013-01-16 DIAGNOSIS — I442 Atrioventricular block, complete: Secondary | ICD-10-CM

## 2013-01-17 ENCOUNTER — Encounter: Payer: Self-pay | Admitting: Internal Medicine

## 2013-01-18 LAB — REMOTE PACEMAKER DEVICE
AL AMPLITUDE: 2.8 mv
AL THRESHOLD: 0.5 V
BATTERY VOLTAGE: 2.79 V

## 2013-02-07 ENCOUNTER — Encounter: Payer: Self-pay | Admitting: *Deleted

## 2013-03-20 ENCOUNTER — Telehealth: Payer: Self-pay

## 2013-03-20 NOTE — Telephone Encounter (Signed)
Received a call from Dr.Donna Helton's office wanting to know if patient requires pre med before dental cleaning and dental work.Dr.Allred not in office today,will send message to him for advice.Call Tibes back at [phone].

## 2013-03-29 NOTE — Telephone Encounter (Signed)
Does not require premed for dental cleaning.

## 2013-03-30 NOTE — Telephone Encounter (Signed)
Returned call to [name] from Dr.Donna Helton's office was told Dr.Allred advised patient does not require pre med for dental cleaning.

## 2013-04-10 ENCOUNTER — Other Ambulatory Visit: Payer: Self-pay | Admitting: Internal Medicine

## 2013-04-10 ENCOUNTER — Encounter: Payer: Self-pay | Admitting: Internal Medicine

## 2013-04-17 ENCOUNTER — Ambulatory Visit (INDEPENDENT_AMBULATORY_CARE_PROVIDER_SITE_OTHER): Payer: Medicare Other | Admitting: *Deleted

## 2013-04-17 DIAGNOSIS — Z950 Presence of cardiac pacemaker: Secondary | ICD-10-CM

## 2013-04-17 DIAGNOSIS — I442 Atrioventricular block, complete: Secondary | ICD-10-CM

## 2013-04-30 LAB — REMOTE PACEMAKER DEVICE
AL AMPLITUDE: 2.8 mv
ATRIAL PACING PM: 55
BATTERY VOLTAGE: 2.78 V
VENTRICULAR PACING PM: 100

## 2013-05-11 ENCOUNTER — Encounter: Payer: Self-pay | Admitting: *Deleted

## 2013-06-19 ENCOUNTER — Encounter: Payer: Self-pay | Admitting: Internal Medicine

## 2013-06-19 ENCOUNTER — Telehealth: Payer: Self-pay | Admitting: Internal Medicine

## 2013-06-19 NOTE — Telephone Encounter (Signed)
New Problem:    Patient called in because he had a couple of fainting spells and wanted to send in a transmission today to see if everything was ok.  Patient has an appointment to see his PCP.  Please call back.

## 2013-06-19 NOTE — Telephone Encounter (Signed)
Has an appointment with Dr Ouida Sills today at 4pm.  He thinks his transmitter is not working.  He wants to have his device checked to make sure all is ok and order new transmitter.  He says on week ago he got faint.  He had gotten too hot working in the yard.  This may have had something to do with his symptoms.  I'm going to try and have him checked in the device clinic as he is feeling better now.  Told him I would call him back

## 2013-06-19 NOTE — Telephone Encounter (Signed)
Be here tomorrow at 1:45 tomorrow and bring transmitter with him  Patient aware

## 2013-06-20 ENCOUNTER — Ambulatory Visit (INDEPENDENT_AMBULATORY_CARE_PROVIDER_SITE_OTHER): Payer: Medicare Other | Admitting: *Deleted

## 2013-06-20 ENCOUNTER — Encounter: Payer: Self-pay | Admitting: Internal Medicine

## 2013-06-20 DIAGNOSIS — I442 Atrioventricular block, complete: Secondary | ICD-10-CM

## 2013-06-20 LAB — PACEMAKER DEVICE OBSERVATION
AL THRESHOLD: 0.5 V
BAMS-0001: 175 {beats}/min
BATTERY VOLTAGE: 2.78 V
RV LEAD THRESHOLD: 0.75 V

## 2013-06-20 NOTE — Progress Notes (Signed)
PPM check in office. 

## 2013-07-11 ENCOUNTER — Encounter: Payer: Self-pay | Admitting: Internal Medicine

## 2013-08-03 ENCOUNTER — Encounter: Payer: Self-pay | Admitting: Internal Medicine

## 2013-09-13 ENCOUNTER — Other Ambulatory Visit: Payer: Self-pay | Admitting: *Deleted

## 2013-09-13 DIAGNOSIS — C8589 Other specified types of non-Hodgkin lymphoma, extranodal and solid organ sites: Secondary | ICD-10-CM

## 2013-09-14 ENCOUNTER — Ambulatory Visit (HOSPITAL_BASED_OUTPATIENT_CLINIC_OR_DEPARTMENT_OTHER): Payer: Medicare Other | Admitting: Oncology

## 2013-09-14 ENCOUNTER — Other Ambulatory Visit (HOSPITAL_BASED_OUTPATIENT_CLINIC_OR_DEPARTMENT_OTHER): Payer: Medicare Other | Admitting: Lab

## 2013-09-14 VITALS — BP 146/73 | HR 81 | Temp 98.1°F | Resp 18 | Ht 68.0 in | Wt 194.7 lb

## 2013-09-14 DIAGNOSIS — C8589 Other specified types of non-Hodgkin lymphoma, extranodal and solid organ sites: Secondary | ICD-10-CM

## 2013-09-14 LAB — CBC WITH DIFFERENTIAL/PLATELET
BASO%: 0.7 % (ref 0.0–2.0)
EOS%: 5 % (ref 0.0–7.0)
MCH: 34.8 pg — ABNORMAL HIGH (ref 27.2–33.4)
MCHC: 34.4 g/dL (ref 32.0–36.0)
MONO#: 0.6 10*3/uL (ref 0.1–0.9)
RBC: 3.94 10*6/uL — ABNORMAL LOW (ref 4.20–5.82)
RDW: 14.8 % — ABNORMAL HIGH (ref 11.0–14.6)
WBC: 6.2 10*3/uL (ref 4.0–10.3)
lymph#: 1 10*3/uL (ref 0.9–3.3)

## 2013-09-14 NOTE — Progress Notes (Signed)
   [facility]    OFFICE PROGRESS NOTE   INTERVAL HISTORY:   [name] returns for scheduled followup with non-Hodgkin's lymphoma. Good appetite. No fever, pain, or night sweats. He remains off of alcohol and attends Alcoholics Anonymous. He reports nocturia and plans to discuss this with Dr. Ouida Sills next month.  Objective:  Vital signs in last 24 hours:  Blood pressure 146/73, pulse 81, temperature 98.1 F (36.7 C), temperature source Oral, resp. rate 18, height 5\' 8"  (1.727 m), weight 194 lb 11.2 oz (88.315 kg).    HEENT: Neck without mass, oropharynx without visible mass, less than 1 cm ulcerated area inferior to the right ear Lymphatics: No cervical, supraclavicular, axillary, or inguinal nodes. Resp: Lungs clear bilaterally Cardio: Regular rate and rhythm GI: No hepatosplenomegaly, nontender, no mass Vascular: No leg edema   Lab Results:  Lab Results  Component Value Date   WBC 6.2 09/14/2013   HGB 13.7 09/14/2013   HCT 39.9 09/14/2013   MCV 101.3* 09/14/2013   PLT 150 09/14/2013   ANC 4.2   Medications: I have reviewed the patient's current medications.  Assessment/Plan: 1. 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 He remains in clinical remission. 2. History of Mild macrocytic anemia, ? related to history of alcohol use or bone marrow toxicity from chemotherapy, stable. 3. History of chronic mild thrombocytopenia. Stable 4. Status post pacemaker placement  Disposition:  [name] remains in clinical remission from non-Hodgkin's lymphoma. He will be [facility] from the Cancer [facility]. [name] will continue clinical followup with Dr. Ouida Sills. We will see him in the future as needed. He will followup with Dr. Ouida Sills within the next month for an influenza vaccine. He will ask Dr. Ouida Sills to evaluate the ulcerated area near the right ear if this persists.   Thornton Papas, MD  09/14/2013  10:07 AM

## 2013-11-03 ENCOUNTER — Encounter: Payer: Medicare Other | Admitting: Internal Medicine

## 2013-11-08 ENCOUNTER — Ambulatory Visit (INDEPENDENT_AMBULATORY_CARE_PROVIDER_SITE_OTHER): Payer: Medicare Other | Admitting: Internal Medicine

## 2013-11-08 ENCOUNTER — Encounter: Payer: Self-pay | Admitting: Internal Medicine

## 2013-11-08 VITALS — BP 146/79 | HR 91 | Ht 67.0 in | Wt 195.8 lb

## 2013-11-08 DIAGNOSIS — Z950 Presence of cardiac pacemaker: Secondary | ICD-10-CM

## 2013-11-08 DIAGNOSIS — I10 Essential (primary) hypertension: Secondary | ICD-10-CM

## 2013-11-08 DIAGNOSIS — I442 Atrioventricular block, complete: Secondary | ICD-10-CM

## 2013-11-08 DIAGNOSIS — I4892 Unspecified atrial flutter: Secondary | ICD-10-CM

## 2013-11-08 LAB — MDC_IDC_ENUM_SESS_TYPE_INCLINIC
Brady Statistic AP VS Percent: 0.1 %
Brady Statistic AS VP Percent: 38.2 %
Lead Channel Pacing Threshold Amplitude: 0.5 V
Lead Channel Pacing Threshold Amplitude: 0.625 V
Lead Channel Pacing Threshold Pulse Width: 0.4 ms
Lead Channel Pacing Threshold Pulse Width: 0.4 ms
Lead Channel Sensing Intrinsic Amplitude: 2.8 mV

## 2013-11-08 NOTE — Patient Instructions (Signed)
Your physician recommends that you continue on your current medications as directed. Please refer to the Current Medication list given to you today.  Remote monitoring is used to monitor your Pacemaker of ICD from home. This monitoring reduces the number of office visits required to check your device to one time per year. It allows [place] to keep an eye on the functioning of your device to ensure it is working properly. You are scheduled for a device check from home on 02/09/2014. You may send your transmission at any time that day. If you have a wireless device, the transmission will be sent automatically. After your physician reviews your transmission, you will receive a postcard with your next transmission date.  Your physician wants you to follow-up in: one year with Dr. Johney Frame.  You will receive a reminder letter in the mail two months in advance. If you don't receive a letter, please call our office to schedule the follow-up appointment.
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
## 2013-12-13 ENCOUNTER — Encounter: Payer: Self-pay | Admitting: Internal Medicine

## 2014-01-31 ENCOUNTER — Encounter (INDEPENDENT_AMBULATORY_CARE_PROVIDER_SITE_OTHER): Payer: Self-pay | Admitting: General Surgery

## 2014-01-31 ENCOUNTER — Encounter (INDEPENDENT_AMBULATORY_CARE_PROVIDER_SITE_OTHER): Payer: Self-pay

## 2014-01-31 ENCOUNTER — Ambulatory Visit (INDEPENDENT_AMBULATORY_CARE_PROVIDER_SITE_OTHER): Payer: Medicare Other | Admitting: General Surgery

## 2014-01-31 VITALS — BP 130/74 | HR 68 | Resp 16 | Ht 67.0 in | Wt 192.0 lb

## 2014-01-31 DIAGNOSIS — K4090 Unilateral inguinal hernia, without obstruction or gangrene, not specified as recurrent: Secondary | ICD-10-CM
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
## 2014-02-07 ENCOUNTER — Telehealth: Payer: Self-pay | Admitting: Internal Medicine

## 2014-02-07 NOTE — Telephone Encounter (Signed)
New transmitter ordered and appointment rescheduled for March 19th.

## 2014-02-07 NOTE — Telephone Encounter (Signed)
PT LEFT MESSAGE RE DOING A TRANSMISSION, HE SAID HE LEFT TRANSMITTER HERE BECAUSE SOMETHING WAS WRONG WITH IT AND HASN'T HEARD BACK FROM [place] ABOUT ANOTHER ONE, PLS ADVISE

## 2014-02-08 ENCOUNTER — Telehealth: Payer: Self-pay | Admitting: Internal Medicine

## 2014-02-08 ENCOUNTER — Telehealth (INDEPENDENT_AMBULATORY_CARE_PROVIDER_SITE_OTHER): Payer: Self-pay

## 2014-02-08 NOTE — Telephone Encounter (Signed)
New message     Need clearance for hernia surgery.  They faxed clearance to [place] on feb 11.  Please fax clearance to [phone] attn dr Donne Hazel

## 2014-02-08 NOTE — Telephone Encounter (Signed)
Called to check on the status of the cardiac clearance for pt. They are going to check with Dr Rayann Heman to see if the pt will need an office visit or just get cleared for sx. The pt has a pacemaker so I need recommendations for surgery.

## 2014-02-08 NOTE — Telephone Encounter (Signed)
Faxed yesterday and will refax today

## 2014-02-09 ENCOUNTER — Encounter: Payer: Medicare Other | Admitting: *Deleted

## 2014-02-09 ENCOUNTER — Encounter (HOSPITAL_COMMUNITY): Payer: Self-pay | Admitting: Pharmacy Technician

## 2014-02-09 NOTE — Telephone Encounter (Addendum)
Called pt to notify him that we did receive medical and cardiac clearance. I will turn his surgical orders into surgery scheduling so they will contact him to schedule surgery. I did advise pt that his medical doctor Dr Willey Blade wants to know his surgery date when he receives this from our office so he can prescribe Flomax the week before surgery and the week after surgery. The pt understands and will contact Dr Willey Blade once he gets his surgery date.

## 2014-02-12 ENCOUNTER — Encounter: Payer: Self-pay | Admitting: *Deleted

## 2014-02-14 ENCOUNTER — Encounter (INDEPENDENT_AMBULATORY_CARE_PROVIDER_SITE_OTHER): Payer: Self-pay

## 2014-02-14 ENCOUNTER — Encounter: Payer: Self-pay | Admitting: Internal Medicine

## 2014-03-08 ENCOUNTER — Encounter: Payer: Medicare Other | Admitting: *Deleted

## 2014-03-12 ENCOUNTER — Telehealth (INDEPENDENT_AMBULATORY_CARE_PROVIDER_SITE_OTHER): Payer: Self-pay | Admitting: *Deleted

## 2014-03-12 ENCOUNTER — Encounter: Payer: Self-pay | Admitting: *Deleted

## 2014-03-12 NOTE — Telephone Encounter (Signed)
[name] from [facility] called. Pt is scheduled for sx 3.25.15 and they do not have the orders from you.  Please advise.  Anderson [place]

## 2014-03-13 ENCOUNTER — Encounter (HOSPITAL_COMMUNITY)
Admission: RE | Admit: 2014-03-13 | Discharge: 2014-03-13 | Disposition: A | Discharge status: Home / Self Care | Payer: Medicare Other | Source: Ambulatory Visit | Attending: General Surgery | Admitting: General Surgery

## 2014-03-13 ENCOUNTER — Encounter (HOSPITAL_COMMUNITY): Payer: Self-pay

## 2014-03-13 ENCOUNTER — Encounter (HOSPITAL_COMMUNITY)
Admission: RE | Admit: 2014-03-13 | Discharge: 2014-03-13 | Disposition: A | Discharge status: Home / Self Care | Payer: Medicare Other | Source: Ambulatory Visit | Attending: Anesthesiology | Admitting: Anesthesiology

## 2014-03-13 HISTORY — DX: Malignant (primary) neoplasm, unspecified: C80.1

## 2014-03-13 LAB — BASIC METABOLIC PANEL
BUN: 17 mg/dL (ref 6–23)
CHLORIDE: 102 meq/L (ref 96–112)
CO2: 27 mEq/L (ref 19–32)
Calcium: 10.2 mg/dL (ref 8.4–10.5)
Creatinine, Ser: 1.03 mg/dL (ref 0.50–1.35)
GFR calc non Af Amer: 68 mL/min — ABNORMAL LOW (ref >90)
GFR, EST AFRICAN AMERICAN: 79 mL/min — AB (ref >90)
Glucose, Bld: 80 mg/dL (ref 70–99)
Potassium: 4.2 mEq/L (ref 3.7–5.3)
Sodium: 140 mEq/L (ref 137–147)

## 2014-03-13 LAB — CBC
HEMATOCRIT: 37.1 % — AB (ref 39.0–52.0)
Hemoglobin: 13 g/dL (ref 13.0–17.0)
MCH: 34.9 pg — ABNORMAL HIGH (ref 26.0–34.0)
MCHC: 35 g/dL (ref 30.0–36.0)
MCV: 99.5 fL (ref 78.0–100.0)
Platelets: 164 10*3/uL (ref 150–400)
RBC: 3.73 MIL/uL — ABNORMAL LOW (ref 4.22–5.81)
RDW: 14.5 % (ref 11.5–15.5)
WBC: 6.5 10*3/uL (ref 4.0–10.5)

## 2014-03-13 NOTE — Pre-Procedure Instructions (Signed)
[name]  03/13/2014   Your procedure is scheduled on:  Wednesday March 25 th at 0830 AM  Report to [facility] Short Stay Main Entrance "A" at 0630 AM.  Call this number if you have problems the morning of surgery: [phone]   Remember:   Do not eat food or drink liquids after midnight.   Take these medicines the morning of surgery with A SIP OF WATER: Clonazepam, Omeprazole,and Ultram if needed for pain   Do not wear jewelry.  Do not wear lotions, powders, or cologne. You may not wear deodorant.   Men may shave face and neck.  Do not bring valuables to the hospital.  [facility] is not responsible for any belongings or valuables.               Contacts, dentures or bridgework may not be worn into surgery.  Leave suitcase in the car. After surgery it may be brought to your room.  For patients admitted to the hospital, discharge time is determined by your treatment team.               Patients discharged the day of surgery will not be allowed to drive home.    Special Instructions:  - Preparing for Surgery  Before surgery, you can play an important role.  Because skin is not sterile, your skin needs to be as free of germs as possible.  You can reduce the number of germs on you skin by washing with CHG (chlorahexidine gluconate) soap before surgery.  CHG is an antiseptic cleaner which kills germs and bonds with the skin to continue killing germs even after washing.  Please DO NOT use if you have an allergy to CHG or antibacterial soaps.  If your skin becomes reddened/irritated stop using the CHG and inform your nurse when you arrive at Short Stay.  Do not shave (including legs and underarms) for at least 48 hours prior to the first CHG shower.  You may shave your face.  Please follow these instructions carefully:   1.  Shower with CHG Soap the night before surgery and the                                morning of Surgery.  2.  If you choose to wash your hair, wash  your hair first as usual with your       normal shampoo.  3.  After you shampoo, rinse your hair and body thoroughly to remove the                      Shampoo.  4.  Use CHG as you would any other liquid soap.  You can apply chg directly       to the skin and wash gently with scrungie or a clean washcloth.  5.  Apply the CHG Soap to your body ONLY FROM THE NECK DOWN.        Do not use on open wounds or open sores.  Avoid contact with your eyes,       ears, mouth and genitals (private parts).  Wash genitals (private parts)       with your normal soap.  6.  Wash thoroughly, paying special attention to the area where your surgery        will be performed.  7.  Thoroughly rinse your body with warm water from the  neck down.  8.  DO NOT shower/wash with your normal soap after using and rinsing off       the CHG Soap.  9.  Pat yourself dry with a clean towel.            10.  Wear clean pajamas.            11.  Place clean sheets on your bed the night of your first shower and do not        sleep with pets.  Day of Surgery  Do not apply any lotions/deoderants the morning of surgery.  Please wear clean clothes to the hospital/surgery center.      Please read over the following fact sheets that you were given: Pain Booklet, Coughing and Deep Breathing and Surgical Site Infection Prevention

## 2014-03-14 ENCOUNTER — Encounter (HOSPITAL_COMMUNITY): Payer: Medicare Other | Admitting: Anesthesiology

## 2014-03-14 ENCOUNTER — Ambulatory Visit (HOSPITAL_COMMUNITY)
Admission: RE | Admit: 2014-03-14 | Discharge: 2014-03-14 | Disposition: A | Discharge status: Home / Self Care | Payer: Medicare Other | Source: Ambulatory Visit | Attending: General Surgery | Admitting: General Surgery

## 2014-03-14 ENCOUNTER — Encounter (HOSPITAL_COMMUNITY)
Admission: RE | Disposition: A | Discharge status: Home / Self Care | Payer: Self-pay | Source: Ambulatory Visit | Attending: General Surgery

## 2014-03-14 ENCOUNTER — Ambulatory Visit (HOSPITAL_COMMUNITY): Payer: Medicare Other | Admitting: Anesthesiology

## 2014-03-14 ENCOUNTER — Encounter (HOSPITAL_COMMUNITY): Payer: Self-pay | Admitting: Anesthesiology

## 2014-03-14 DIAGNOSIS — Z01812 Encounter for preprocedural laboratory examination: Secondary | ICD-10-CM | POA: Insufficient documentation

## 2014-03-14 DIAGNOSIS — I2584 Coronary atherosclerosis due to calcified coronary lesion: Secondary | ICD-10-CM | POA: Insufficient documentation

## 2014-03-14 DIAGNOSIS — Z01818 Encounter for other preprocedural examination: Secondary | ICD-10-CM | POA: Insufficient documentation

## 2014-03-14 DIAGNOSIS — E785 Hyperlipidemia, unspecified: Secondary | ICD-10-CM | POA: Insufficient documentation

## 2014-03-14 DIAGNOSIS — K4090 Unilateral inguinal hernia, without obstruction or gangrene, not specified as recurrent: Secondary | ICD-10-CM | POA: Insufficient documentation

## 2014-03-14 DIAGNOSIS — I2510 Atherosclerotic heart disease of native coronary artery without angina pectoris: Secondary | ICD-10-CM | POA: Insufficient documentation

## 2014-03-14 DIAGNOSIS — Z01810 Encounter for preprocedural cardiovascular examination: Secondary | ICD-10-CM | POA: Insufficient documentation

## 2014-03-14 DIAGNOSIS — I442 Atrioventricular block, complete: Secondary | ICD-10-CM | POA: Insufficient documentation

## 2014-03-14 DIAGNOSIS — Z950 Presence of cardiac pacemaker: Secondary | ICD-10-CM | POA: Insufficient documentation

## 2014-03-14 DIAGNOSIS — I10 Essential (primary) hypertension: Secondary | ICD-10-CM | POA: Insufficient documentation

## 2014-03-14 DIAGNOSIS — Z7982 Long term (current) use of aspirin: Secondary | ICD-10-CM | POA: Insufficient documentation

## 2014-03-14 DIAGNOSIS — Z87891 Personal history of nicotine dependence: Secondary | ICD-10-CM | POA: Insufficient documentation

## 2014-03-14 DIAGNOSIS — I4892 Unspecified atrial flutter: Secondary | ICD-10-CM | POA: Insufficient documentation

## 2014-03-14 HISTORY — PX: INGUINAL HERNIA REPAIR: SHX194

## 2014-03-14 HISTORY — PX: INSERTION OF MESH: SHX5868

## 2014-03-14 SURGERY — REPAIR, HERNIA, INGUINAL, ADULT
Anesthesia: General | Site: Abdomen | Laterality: Right

## 2014-03-14 MED ORDER — OXYCODONE-ACETAMINOPHEN 5-325 MG PO TABS
1.0000 | ORAL_TABLET | ORAL | Status: DC | PRN
  Administered 2014-03-14: 2 via ORAL

## 2014-03-14 MED ORDER — LIDOCAINE HCL 4 % MT SOLN
OROMUCOSAL | Status: DC | PRN
  Administered 2014-03-14: 4 mL via TOPICAL

## 2014-03-14 MED ORDER — LIDOCAINE HCL (CARDIAC) 20 MG/ML IV SOLN
INTRAVENOUS | Status: AC
  Filled 2014-03-14: qty 5

## 2014-03-14 MED ORDER — FENTANYL CITRATE 0.05 MG/ML IJ SOLN
INTRAMUSCULAR | Status: DC | PRN
  Administered 2014-03-14: 100 ug via INTRAVENOUS
  Administered 2014-03-14: 50 ug via INTRAVENOUS

## 2014-03-14 MED ORDER — NEOSTIGMINE METHYLSULFATE 1 MG/ML IJ SOLN
INTRAMUSCULAR | Status: AC
  Filled 2014-03-14: qty 10

## 2014-03-14 MED ORDER — BUPIVACAINE HCL (PF) 0.25 % IJ SOLN
INTRAMUSCULAR | Status: DC | PRN
  Administered 2014-03-14: 10 mL

## 2014-03-14 MED ORDER — FENTANYL CITRATE 0.05 MG/ML IJ SOLN
25.0000 ug | INTRAMUSCULAR | Status: DC | PRN
  Administered 2014-03-14 (×4): 25 ug via INTRAVENOUS

## 2014-03-14 MED ORDER — ROCURONIUM BROMIDE 100 MG/10ML IV SOLN
INTRAVENOUS | Status: DC | PRN
  Administered 2014-03-14: 40 mg via INTRAVENOUS
  Administered 2014-03-14: 10 mg via INTRAVENOUS

## 2014-03-14 MED ORDER — SODIUM CHLORIDE 0.9 % IJ SOLN: 3.0000 mL | Freq: Two times a day (BID) | INTRAMUSCULAR | Status: DC

## 2014-03-14 MED ORDER — OXYCODONE-ACETAMINOPHEN 5-325 MG PO TABS
ORAL_TABLET | ORAL | Status: AC
  Filled 2014-03-14: qty 1

## 2014-03-14 MED ORDER — SODIUM CHLORIDE 0.9 % IV SOLN: INTRAVENOUS | Status: DC

## 2014-03-14 MED ORDER — LACTATED RINGERS IV SOLN
INTRAVENOUS | Status: DC | PRN
  Administered 2014-03-14 (×2): via INTRAVENOUS

## 2014-03-14 MED ORDER — GLYCOPYRROLATE 0.2 MG/ML IJ SOLN
INTRAMUSCULAR | Status: DC | PRN
  Administered 2014-03-14: .5 mg via INTRAVENOUS

## 2014-03-14 MED ORDER — OXYCODONE HCL 5 MG PO TABS
ORAL_TABLET | ORAL | Status: AC
  Filled 2014-03-14: qty 1

## 2014-03-14 MED ORDER — LIDOCAINE HCL (CARDIAC) 20 MG/ML IV SOLN
INTRAVENOUS | Status: DC | PRN
  Administered 2014-03-14: 60 mg via INTRAVENOUS

## 2014-03-14 MED ORDER — BUPIVACAINE HCL (PF) 0.25 % IJ SOLN
INTRAMUSCULAR | Status: AC
  Filled 2014-03-14: qty 30

## 2014-03-14 MED ORDER — OXYCODONE HCL 5 MG PO TABS
5.0000 mg | ORAL_TABLET | ORAL | Status: DC | PRN
  Administered 2014-03-14: 5 mg via ORAL

## 2014-03-14 MED ORDER — PROPOFOL 10 MG/ML IV BOLUS
INTRAVENOUS | Status: AC
  Filled 2014-03-14: qty 20

## 2014-03-14 MED ORDER — ACETAMINOPHEN 650 MG RE SUPP: 650.0000 mg | RECTAL | Status: DC | PRN

## 2014-03-14 MED ORDER — ONDANSETRON HCL 4 MG/2ML IJ SOLN
INTRAMUSCULAR | Status: AC
  Filled 2014-03-14: qty 2

## 2014-03-14 MED ORDER — FENTANYL CITRATE 0.05 MG/ML IJ SOLN
INTRAMUSCULAR | Status: AC
  Filled 2014-03-14: qty 5

## 2014-03-14 MED ORDER — ROCURONIUM BROMIDE 50 MG/5ML IV SOLN
INTRAVENOUS | Status: AC
  Filled 2014-03-14: qty 1

## 2014-03-14 MED ORDER — MIDAZOLAM HCL 2 MG/2ML IJ SOLN
INTRAMUSCULAR | Status: AC
  Filled 2014-03-14: qty 2

## 2014-03-14 MED ORDER — CEFAZOLIN SODIUM-DEXTROSE 2-3 GM-% IV SOLR
2.0000 g | Freq: Once | INTRAVENOUS | Status: AC
  Administered 2014-03-14: 2 g via INTRAVENOUS

## 2014-03-14 MED ORDER — MORPHINE SULFATE 2 MG/ML IJ SOLN: 2.0000 mg | INTRAMUSCULAR | Status: DC | PRN

## 2014-03-14 MED ORDER — CEFAZOLIN SODIUM-DEXTROSE 2-3 GM-% IV SOLR
INTRAVENOUS | Status: AC
  Filled 2014-03-14: qty 50

## 2014-03-14 MED ORDER — FENTANYL CITRATE 0.05 MG/ML IJ SOLN
INTRAMUSCULAR | Status: AC
  Administered 2014-03-14: 25 ug via INTRAVENOUS
  Filled 2014-03-14: qty 2

## 2014-03-14 MED ORDER — ACETAMINOPHEN 325 MG PO TABS: 650.0000 mg | ORAL_TABLET | ORAL | Status: DC | PRN

## 2014-03-14 MED ORDER — SODIUM CHLORIDE 0.9 % IV SOLN: 250.0000 mL | INTRAVENOUS | Status: DC | PRN

## 2014-03-14 MED ORDER — ONDANSETRON HCL 4 MG/2ML IJ SOLN
INTRAMUSCULAR | Status: DC | PRN
  Administered 2014-03-14: 4 mg via INTRAVENOUS

## 2014-03-14 MED ORDER — SODIUM CHLORIDE 0.9 % IJ SOLN: 3.0000 mL | INTRAMUSCULAR | Status: DC | PRN

## 2014-03-14 MED ORDER — PROPOFOL 10 MG/ML IV BOLUS
INTRAVENOUS | Status: DC | PRN
  Administered 2014-03-14: 140 mg via INTRAVENOUS
  Administered 2014-03-14 (×2): 20 mg via INTRAVENOUS

## 2014-03-14 MED ORDER — GLYCOPYRROLATE 0.2 MG/ML IJ SOLN
INTRAMUSCULAR | Status: AC
  Filled 2014-03-14: qty 3

## 2014-03-14 MED ORDER — 0.9 % SODIUM CHLORIDE (POUR BTL) OPTIME
TOPICAL | Status: DC | PRN
  Administered 2014-03-14: 1000 mL

## 2014-03-14 MED ORDER — ARTIFICIAL TEARS OP OINT
TOPICAL_OINTMENT | OPHTHALMIC | Status: DC | PRN
  Administered 2014-03-14: 1 via OPHTHALMIC

## 2014-03-14 MED ORDER — NEOSTIGMINE METHYLSULFATE 1 MG/ML IJ SOLN
INTRAMUSCULAR | Status: DC | PRN
  Administered 2014-03-14: 4 mg via INTRAVENOUS

## 2014-03-14 SURGICAL SUPPLY — 54 items
ADH SKN CLS LQ APL DERMABOND (GAUZE/BANDAGES/DRESSINGS) ×1
APPLIER CLIP 9.375 MED OPEN (MISCELLANEOUS) ×3
APR CLP MED 9.3 20 MLT OPN (MISCELLANEOUS) ×1
BLADE SURG 10 STRL SS (BLADE) ×3 IMPLANT
BLADE SURG 15 STRL LF DISP TIS (BLADE) ×1 IMPLANT
BLADE SURG 15 STRL SS (BLADE) ×3
BLADE SURG ROTATE 9660 (MISCELLANEOUS) IMPLANT
CANISTER SUCTION 2500CC (MISCELLANEOUS) IMPLANT
CHLORAPREP W/TINT 26ML (MISCELLANEOUS) ×3 IMPLANT
CLIP APPLIE 9.375 MED OPEN (MISCELLANEOUS) IMPLANT
COVER SURGICAL LIGHT HANDLE (MISCELLANEOUS) ×3 IMPLANT
DECANTER SPIKE VIAL GLASS SM (MISCELLANEOUS) IMPLANT
DERMABOND ADHESIVE PROPEN (GAUZE/BANDAGES/DRESSINGS) ×2
DERMABOND ADVANCED .7 DNX6 (GAUZE/BANDAGES/DRESSINGS) ×1 IMPLANT
DRAIN PENROSE 1/2X12 LTX STRL (WOUND CARE) ×2 IMPLANT
DRAPE LAPAROTOMY TRNSV 102X78 (DRAPE) ×3 IMPLANT
ELECT CAUTERY BLADE 6.4 (BLADE) ×3 IMPLANT
ELECT REM PT RETURN 9FT ADLT (ELECTROSURGICAL) ×3
ELECTRODE REM PT RTRN 9FT ADLT (ELECTROSURGICAL) ×1 IMPLANT
GAUZE SPONGE 4X4 16PLY XRAY LF (GAUZE/BANDAGES/DRESSINGS) ×3 IMPLANT
GLOVE BIO SURGEON STRL SZ7 (GLOVE) ×3 IMPLANT
GLOVE BIOGEL PI IND STRL 7.5 (GLOVE) ×1 IMPLANT
GLOVE BIOGEL PI INDICATOR 7.5 (GLOVE) ×2
GOWN STRL REUS W/ TWL LRG LVL3 (GOWN DISPOSABLE) ×2 IMPLANT
GOWN STRL REUS W/TWL LRG LVL3 (GOWN DISPOSABLE) ×6
KIT BASIN OR (CUSTOM PROCEDURE TRAY) ×3 IMPLANT
KIT ROOM TURNOVER OR (KITS) ×3 IMPLANT
MESH HERNIA SYS ULTRAPRO LRG (Mesh General) ×2 IMPLANT
NDL HYPO 25GX1X1/2 BEV (NEEDLE) ×1 IMPLANT
NEEDLE HYPO 25GX1X1/2 BEV (NEEDLE) ×3 IMPLANT
NS IRRIG 1000ML POUR BTL (IV SOLUTION) ×3 IMPLANT
PACK SURGICAL SETUP 50X90 (CUSTOM PROCEDURE TRAY) ×3 IMPLANT
PAD ARMBOARD 7.5X6 YLW CONV (MISCELLANEOUS) ×3 IMPLANT
PENCIL BUTTON HOLSTER BLD 10FT (ELECTRODE) ×3 IMPLANT
SPONGE GAUZE 4X4 12PLY STER LF (GAUZE/BANDAGES/DRESSINGS) ×2 IMPLANT
SPONGE LAP 18X18 X RAY DECT (DISPOSABLE) ×3 IMPLANT
SUT MNCRL AB 4-0 PS2 18 (SUTURE) ×3 IMPLANT
SUT VIC AB 0 CT1 27 (SUTURE)
SUT VIC AB 0 CT1 27XBRD ANBCTR (SUTURE) IMPLANT
SUT VIC AB 2-0 BRD 54 (SUTURE) ×2 IMPLANT
SUT VIC AB 2-0 CT1 27 (SUTURE) ×3
SUT VIC AB 2-0 CT1 TAPERPNT 27 (SUTURE) ×1 IMPLANT
SUT VIC AB 2-0 SH 18 (SUTURE) ×5 IMPLANT
SUT VIC AB 3-0 SH 27 (SUTURE) ×6
SUT VIC AB 3-0 SH 27XBRD (SUTURE) ×1 IMPLANT
SUT VICRYL AB 2 0 TIES (SUTURE) ×5 IMPLANT
SYR BULB IRRIGATION 50ML (SYRINGE) ×2 IMPLANT
SYR CONTROL 10ML LL (SYRINGE) ×3 IMPLANT
TAPE CLOTH SURG 6X10 WHT LF (GAUZE/BANDAGES/DRESSINGS) ×2 IMPLANT
TOWEL OR 17X24 6PK STRL BLUE (TOWEL DISPOSABLE) ×3 IMPLANT
TOWEL OR 17X26 10 PK STRL BLUE (TOWEL DISPOSABLE) ×3 IMPLANT
TUBE CONNECTING 12'X1/4 (SUCTIONS)
TUBE CONNECTING 12X1/4 (SUCTIONS) IMPLANT
YANKAUER SUCT BULB TIP NO VENT (SUCTIONS) IMPLANT

## 2014-03-14 NOTE — Anesthesia Postprocedure Evaluation (Signed)
  Anesthesia Post-op Note  Patient: [name]  Procedure(s) Performed: Procedure(s): HERNIA REPAIR INGUINAL ADULT (Right) INSERTION OF MESH (Right)  Patient Location: PACU  Anesthesia Type:General  Level of Consciousness: awake  Airway and Oxygen Therapy: Patient Spontanous Breathing  Post-op Pain: mild  Post-op Assessment: Post-op Vital signs reviewed  Post-op Vital Signs: Reviewed  Complications: No apparent anesthesia complications

## 2014-03-14 NOTE — Anesthesia Procedure Notes (Signed)
Procedure Name: Intubation Date/Time: 03/14/2014 8:29 AM Performed by: Izora Gala Pre-anesthesia Checklist: Patient identified, Emergency Drugs available, Suction available and Patient being monitored Patient Re-evaluated:Patient Re-evaluated prior to inductionOxygen Delivery Method: Circle system utilized Preoxygenation: Pre-oxygenation with 100% oxygen Intubation Type: IV induction Ventilation: Mask ventilation without difficulty Laryngoscope Size: Miller and 3 Grade View: Grade II Tube type: Oral Tube size: 8.0 mm Number of attempts: 1 Airway Equipment and Method: Stylet Placement Confirmation: ETT inserted through vocal cords under direct vision,  positive ETCO2 and breath sounds checked- equal and bilateral Secured at: 22 cm Tube secured with: Tape Dental Injury: Teeth and Oropharynx as per pre-operative assessment

## 2014-03-14 NOTE — Anesthesia Preprocedure Evaluation (Addendum)
Anesthesia Evaluation  Patient identified by MRN, date of birth, ID band Patient awake    Reviewed: Allergy & Precautions, H&P , NPO status , Patient's Chart, lab work & pertinent test results  History of Anesthesia Complications Negative for: history of anesthetic complications  Airway Mallampati: II      Dental   Pulmonary former smoker,  Quit 3-4 years ago breath sounds clear to auscultation  Pulmonary exam normal       Cardiovascular hypertension, Pt. on medications + CAD + dysrhythmias + pacemaker Rhythm:Irregular Rate:Normal  Bradycardia   Neuro/Psych negative neurological ROS  negative psych ROS   GI/Hepatic negative GI ROS, Neg liver ROS,   Endo/Other  negative endocrine ROS  Renal/GU negative Renal ROS     Musculoskeletal   Abdominal Normal abdominal exam  (+)   Peds  Hematology negative hematology ROS (+)   Anesthesia Other Findings   Reproductive/Obstetrics                          Anesthesia Physical Anesthesia Plan  ASA: III  Anesthesia Plan: General   Post-op Pain Management:    Induction: Intravenous  Airway Management Planned: Oral ETT  Additional Equipment:   Intra-op Plan:   Post-operative Plan: Extubation in OR  Informed Consent: I have reviewed the patients History and Physical, chart, labs and discussed the procedure including the risks, benefits and alternatives for the proposed anesthesia with the patient or authorized representative who has indicated his/her understanding and acceptance.   Dental advisory given  Plan Discussed with: CRNA, Anesthesiologist and Surgeon  Anesthesia Plan Comments:         Anesthesia Quick Evaluation
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
## 2014-03-14 NOTE — Transfer of Care (Signed)
Immediate Anesthesia Transfer of Care Note  Patient: [name]  Procedure(s) Performed: Procedure(s): HERNIA REPAIR INGUINAL ADULT (Right) INSERTION OF MESH (Right)  Patient Location: PACU  Anesthesia Type:General  Level of Consciousness: awake, alert , oriented and patient cooperative  Airway & Oxygen Therapy: Patient Spontanous Breathing and Patient connected to face mask oxygen  Post-op Assessment: Report given to PACU RN  Post vital signs: Reviewed and stable  Complications: No apparent anesthesia complications

## 2014-03-14 NOTE — Discharge Instructions (Signed)
[facility] ° °UMBILICAL OR INGUINAL HERNIA REPAIR: POST OP INSTRUCTIONS ° °Always review your discharge instruction sheet given to you by the facility where your surgery was performed. °IF YOU HAVE DISABILITY OR FAMILY LEAVE FORMS, YOU MUST BRING THEM TO THE OFFICE FOR PROCESSING.   °DO NOT GIVE THEM TO YOUR DOCTOR. ° °1. A  prescription for pain medication may be given to you upon discharge.  Take your pain medication as prescribed, if needed.  If narcotic pain medicine is not needed, then you may take acetaminophen (Tylenol), naprosyn (Alleve) or ibuprofen (Advil) as needed. °2. Take your usually prescribed medications unless otherwise directed. °3. If you need a refill on your pain medication, please contact your pharmacy.  They will contact our office to request authorization. Prescriptions will not be filled after 5 pm or on week-ends. °4. You should follow a light diet the first 24 hours after arrival home, such as soup and crackers, etc.  Be sure to include lots of fluids daily.  Resume your normal diet the day after surgery. °5. Most patients will experience some swelling and bruising around the umbilicus or in the groin and scrotum.  Ice packs and reclining will help.  Swelling and bruising can take several days to resolve.  °6. It is common to experience some constipation if taking pain medication after surgery.  Increasing fluid intake and taking a stool softener (such as Colace) will usually help or prevent this problem from occurring.  A mild laxative (Milk of Magnesia or Miralax) should be taken according to package directions if there are no bowel movements after 48 hours. °7. Unless discharge instructions indicate otherwise, you may remove your bandages 48 hours after surgery, and you may shower at that time.  You may have steri-strips (small skin tapes) in place directly over the incision.  These strips should be left on the skin for 7-10 days and will come off on their own.   If your surgeon used skin glue on the incision, you may shower in 24 hours.  The glue will flake off over the next 2-3 weeks.  Any sutures or staples will be removed at the office during your follow-up visit. °8. ACTIVITIES:  You may resume regular (light) daily activities beginning the next day--such as daily self-care, walking, climbing stairs--gradually increasing activities as tolerated.  You may have sexual intercourse when it is comfortable.  Refrain from any heavy lifting or straining until approved by your doctor. °a. You may drive when you are no longer taking prescription pain medication, you can comfortably wear a seatbelt, and you can safely maneuver your car and apply brakes. °b. RETURN TO WORK:  __________________________________________________________ °9. You should see your doctor in the office for a follow-up appointment approximately 2-3 weeks after your surgery.  Make sure that you call for this appointment within a day or two after you arrive home to insure a convenient appointment time. °10. OTHER INSTRUCTIONS:  __________________________________________________________________________________________________________________________________________________________________________________________  °WHEN TO CALL YOUR DOCTOR: °1. Fever over 101.0 °2. Inability to urinate °3. Nausea and/or vomiting °4. Extreme swelling or bruising °5. Continued bleeding from incision. °6. Increased pain, redness, or drainage from the incision ° °The clinic staff is available to answer your questions during regular business hours.  Please don’t hesitate to call and ask to speak to one of the nurses for clinical concerns.  If you have a medical emergency, go to the nearest emergency room or call 911.  A surgeon from [facility]   is always on call at the hospital ° ° °[address], [address], [place], [place]  [address] ? ° [address] [address], [place], [place]   [address] °[phone] ? [phone] ? FAX  [phone] °Web site: [email or web address] ° ° °

## 2014-03-14 NOTE — Op Note (Signed)
Preoperative diagnosis: right inguinal hernia Postoperative diagnosis: pantaloon right inguinal hernia Procedure: right inguinal hernia repair with ultrapro hernia system Surgeon: Dr Serita Grammes EBL: minimal Specimens: none Drains: none Complications: none Sponge and needle count correct Dispo: stable  Indications: This is a 34 yom with a symptomatic right inguinal hernia who presents for right groin hernia repair.  We discussed risks and benefits of repair prior to beginning.  Procedure: After informed consent was obtained the patient was taken to the operating room. He was given cefazolin and sequential compression devices were placed.  He was placed under general anesthesia without complication.  His right groin was prepped and draped in the standard sterile surgical fashion.  A surgical timeout was then performed.  I blocked the ilioinguinal nerve and infiltrated marcaine in the right groin.  I then made a right groin incision. I carried this to the external oblique. Then I incised this to the external ring. He had a very large pantaloon hernia. He also had a very large cord lipoma. I excised the cord lipoma. I oversewed the stump. I then developed the preperitoneal space with a sponge. I then placed an ultrapro mesh patch after reducing the indirect hernia into this space. I closed down my internal ring with 2-0 Vicryl sutures. The bottom portion of this bilayer was flat and in good position. I then deployed the top portion of the mesh. I made a cut and wrapped it around the spermatic cord. I secured this in numerous positions including to the pubic tubercle with 2-0 Vicryl sutures. I sutured this to the inguinal ligament about every 1 cm. This was then laid flat laterally. This completely obliterated the space. Hemostasis was obtained there was a fair amount of oozing from a variety of places. I then closed the external oblique with a 2-0 Vicryl. Scarpa's fascia was closed with a 3-0 Vicryl I  did close some of his other soft tissue with a 3-0 Vicryl due to his body habitus. I then closed the incision with 4-0 Monocryl and Dermabond. He tolerated this well was extubated and transferred to recovery stable.

## 2014-03-14 NOTE — Preoperative (Signed)
Beta Blockers   Reason not to administer Beta Blockers:Not Applicable 

## 2014-03-15 ENCOUNTER — Ambulatory Visit (INDEPENDENT_AMBULATORY_CARE_PROVIDER_SITE_OTHER): Payer: Medicare Other

## 2014-03-15 DIAGNOSIS — I442 Atrioventricular block, complete: Secondary | ICD-10-CM

## 2014-03-15 DIAGNOSIS — I4892 Unspecified atrial flutter: Secondary | ICD-10-CM

## 2014-03-15 LAB — MDC_IDC_ENUM_SESS_TYPE_REMOTE
Battery Impedance: 207 Ohm
Battery Voltage: 2.79 V
Brady Statistic AP VP Percent: 62.7 %
Brady Statistic AP VS Percent: 0.1 % — CL
Brady Statistic AS VP Percent: 37.3 %
Brady Statistic AS VS Percent: 0.1 % — CL
Lead Channel Impedance Value: 518 Ohm
Lead Channel Pacing Threshold Amplitude: 0.625 V
Lead Channel Pacing Threshold Pulse Width: 0.4 ms
Lead Channel Pacing Threshold Pulse Width: 0.4 ms
Lead Channel Sensing Intrinsic Amplitude: 2.8 mV
Lead Channel Setting Pacing Amplitude: 2 V
Lead Channel Setting Pacing Pulse Width: 0.4 ms
MDC IDC MSMT LEADCHNL RV IMPEDANCE VALUE: 467 Ohm
MDC IDC MSMT LEADCHNL RV PACING THRESHOLD AMPLITUDE: 0.625 V
MDC IDC SET LEADCHNL RA PACING AMPLITUDE: 1.5 V
MDC IDC SET LEADCHNL RV SENSING SENSITIVITY: 2.8 mV

## 2014-03-16 ENCOUNTER — Encounter (HOSPITAL_COMMUNITY): Payer: Self-pay | Admitting: General Surgery

## 2014-03-16 ENCOUNTER — Telehealth: Payer: Self-pay | Admitting: Internal Medicine

## 2014-03-16 NOTE — Telephone Encounter (Signed)
Informed patient that remote was received. 

## 2014-03-16 NOTE — Telephone Encounter (Signed)
New Message:  Pt is calling to let our device clinic know he just sent in a remote transmission. States he missed it a few days back. Pt would like a call back to confirm if we received his transmission.

## 2014-03-26 ENCOUNTER — Encounter: Payer: Self-pay | Admitting: Internal Medicine

## 2014-03-28 ENCOUNTER — Telehealth: Payer: Self-pay

## 2014-03-28 NOTE — Telephone Encounter (Signed)
Pt was referred by Dr. Willey Blade for screening colonoscopy. His last was done by Dr. Gala Romney on 05/09/2004. His labs sent from Dr. Willey Blade showed a hemoglobin of 12.4 ( low) on  03/19/2014.  I have called and LMOM for a return call to schedule OV prior to colonoscopy.

## 2014-03-30 ENCOUNTER — Encounter: Payer: Self-pay | Admitting: *Deleted

## 2014-04-02 ENCOUNTER — Telehealth: Payer: Self-pay | Admitting: Internal Medicine

## 2014-04-02 NOTE — Telephone Encounter (Signed)
Pt said that he was returning DS call from last week. Please call him back at [phone]

## 2014-04-03 NOTE — Telephone Encounter (Signed)
See phone note of 03/28/2014.

## 2014-04-03 NOTE — Telephone Encounter (Signed)
Pt is scheduled for an office visit with Laban Emperor, NP on 04/30/2014 at 2:30 PM.

## 2014-04-04 ENCOUNTER — Encounter (INDEPENDENT_AMBULATORY_CARE_PROVIDER_SITE_OTHER): Payer: Self-pay

## 2014-04-24 ENCOUNTER — Ambulatory Visit (INDEPENDENT_AMBULATORY_CARE_PROVIDER_SITE_OTHER): Payer: Medicare Other | Admitting: General Surgery

## 2014-04-24 ENCOUNTER — Encounter (INDEPENDENT_AMBULATORY_CARE_PROVIDER_SITE_OTHER): Payer: Self-pay | Admitting: General Surgery

## 2014-04-24 VITALS — BP 126/78 | HR 74 | Temp 97.6°F | Ht 68.0 in | Wt 194.0 lb

## 2014-04-24 DIAGNOSIS — Z09 Encounter for follow-up examination after completed treatment for conditions other than malignant neoplasm: Secondary | ICD-10-CM

## 2014-04-24 NOTE — Progress Notes (Signed)
Subjective:     Patient ID: [name], male   DOB: 1935-04-30, 79 y.o.   MRN: [id number]  HPI 79 yom s/p open rih with mesh for large pantaloon hernia. He had bruising and swelling postop which are now resolved. He returns today doing well without complaints  Review of Systems     Objective:   Physical Exam Healing right groin incision, no ecchymosis, no infection    Assessment:     S/p rih repair     Plan:     Im not surprised at his postoperative course given his hernia. He can begin returning to full activity. I will see him back as needed.

## 2014-04-30 ENCOUNTER — Encounter: Payer: Self-pay | Admitting: Gastroenterology

## 2014-04-30 ENCOUNTER — Other Ambulatory Visit: Payer: Self-pay

## 2014-04-30 ENCOUNTER — Telehealth: Payer: Self-pay

## 2014-04-30 ENCOUNTER — Ambulatory Visit (INDEPENDENT_AMBULATORY_CARE_PROVIDER_SITE_OTHER): Payer: Medicare Other | Admitting: Gastroenterology

## 2014-04-30 VITALS — BP 122/72 | HR 80 | Temp 97.4°F | Ht 65.0 in | Wt 195.6 lb

## 2014-04-30 DIAGNOSIS — Z1211 Encounter for screening for malignant neoplasm of colon: Secondary | ICD-10-CM

## 2014-04-30 NOTE — Telephone Encounter (Signed)
I forgot to add the pacemaker in the orders and I called [name] and she added to the special needs.

## 2014-04-30 NOTE — Telephone Encounter (Signed)
Gastroenterology Pre-Procedure Review  Request Date: 04/30/2014 Requesting Physician: Dr. Willey Blade  LAST COLONOSCOPY WAS 05/09/2004 BY DR. Gala Romney  PT DOES HAVE A PACEMAKER  PATIENT REVIEW QUESTIONS: The patient responded to the following health history questions as indicated:    1. Diabetes Melitis: no 2. Joint replacements in the past 12 months: no 3. Major health problems in the past 3 months: no 4. Has an artificial valve or MVP: no 5. Has a defibrillator: no 6. Has been advised in past to take antibiotics in advance of a procedure like teeth cleaning: no    MEDICATIONS & ALLERGIES:    Patient reports the following regarding taking any blood thinners:   Plavix? no Aspirin? no Coumadin? no  Patient confirms/reports the following medications:  Current Outpatient Prescriptions  Medication Sig Dispense Refill  . Apoaequorin (PREVAGEN PO) Take 1 capsule by mouth daily. For memory      . aspirin EC 81 MG tablet Take 81 mg by mouth daily.        . clonazePAM (KLONOPIN) 1 MG tablet Take 1 mg by mouth daily.       [name] losartan (COZAAR) 50 MG tablet Take 50 mg by mouth Daily.      . Multiple Vitamin (MULTIVITAMIN WITH MINERALS) TABS tablet Take 1 tablet by mouth daily. Centrum Silver      . omeprazole (PRILOSEC) 20 MG capsule Take 20 mg by mouth daily as needed (acid indigestion). Acid reflex      . simvastatin (ZOCOR) 40 MG tablet Take 40 mg by mouth daily.       . traMADol (ULTRAM) 50 MG tablet Take 50 mg by mouth 2 (two) times daily as needed for moderate pain.       . traZODone (DESYREL) 50 MG tablet Take 50 mg by mouth at bedtime.       No current facility-administered medications for this visit.    Patient confirms/reports the following allergies:  No Known Allergies  No orders of the defined types were placed in this encounter.    AUTHORIZATION INFORMATION Primary Insurance:   ID #:   Group #:  Pre-Cert / Auth required:  Pre-Cert / Auth #:   Secondary Insurance:   ID #:  Group #:  Pre-Cert / Auth required:  Pre-Cert / Auth #:   SCHEDULE INFORMATION: Procedure has been scheduled as follows:  Date: 05/16/2014                  Time:  1:00 pm Location: [facility] Short Stay  This Gastroenterology Pre-Precedure Review Form is being routed to the following provider(s): R. Garfield Cornea, MD

## 2014-05-01 NOTE — Telephone Encounter (Signed)
Appropriate.

## 2014-05-02 DIAGNOSIS — Z1211 Encounter for screening for malignant neoplasm of colon: Secondary | ICD-10-CM | POA: Insufficient documentation

## 2014-05-02 MED ORDER — PEG-KCL-NACL-NASULF-NA ASC-C 100 G PO SOLR: 1.0000 | ORAL | Status: DC

## 2014-05-02 NOTE — Progress Notes (Signed)
Patient scheduled for OV prior to colonoscopy; however, he is not having any issues. Triaged per Waldon Merl, LPN. Hgb 12.2 on outside labs, but this appears chronic.

## 2014-05-02 NOTE — Telephone Encounter (Signed)
Rx sent to the pharmacy and instructions mailed to pt.  

## 2014-05-03 ENCOUNTER — Encounter (HOSPITAL_COMMUNITY): Payer: Self-pay | Admitting: Pharmacy Technician

## 2014-05-07 ENCOUNTER — Telehealth: Payer: Self-pay

## 2014-05-07 NOTE — Telephone Encounter (Signed)
Pt called to reschedule colonoscopy from 05/16/2014 to 05/23/2014 at 7:30 AM. He is aware that he will need to be at the hospital at 6:30 AM. Maudie Mercury is aware of the reschedule.

## 2014-05-23 ENCOUNTER — Encounter (HOSPITAL_COMMUNITY)
Admission: RE | Disposition: A | Discharge status: Home / Self Care | Payer: Self-pay | Source: Ambulatory Visit | Attending: Internal Medicine

## 2014-05-23 ENCOUNTER — Encounter (HOSPITAL_COMMUNITY): Payer: Self-pay

## 2014-05-23 ENCOUNTER — Ambulatory Visit (HOSPITAL_COMMUNITY)
Admission: RE | Admit: 2014-05-23 | Discharge: 2014-05-23 | Disposition: A | Discharge status: Home / Self Care | Payer: Medicare Other | Source: Ambulatory Visit | Attending: Internal Medicine | Admitting: Internal Medicine

## 2014-05-23 DIAGNOSIS — Z87898 Personal history of other specified conditions: Secondary | ICD-10-CM | POA: Insufficient documentation

## 2014-05-23 DIAGNOSIS — D128 Benign neoplasm of rectum: Secondary | ICD-10-CM | POA: Insufficient documentation

## 2014-05-23 DIAGNOSIS — E785 Hyperlipidemia, unspecified: Secondary | ICD-10-CM | POA: Insufficient documentation

## 2014-05-23 DIAGNOSIS — I2510 Atherosclerotic heart disease of native coronary artery without angina pectoris: Secondary | ICD-10-CM | POA: Insufficient documentation

## 2014-05-23 DIAGNOSIS — Z950 Presence of cardiac pacemaker: Secondary | ICD-10-CM | POA: Insufficient documentation

## 2014-05-23 DIAGNOSIS — K620 Anal polyp: Secondary | ICD-10-CM

## 2014-05-23 DIAGNOSIS — Z7982 Long term (current) use of aspirin: Secondary | ICD-10-CM | POA: Insufficient documentation

## 2014-05-23 DIAGNOSIS — I10 Essential (primary) hypertension: Secondary | ICD-10-CM | POA: Insufficient documentation

## 2014-05-23 DIAGNOSIS — Z79899 Other long term (current) drug therapy: Secondary | ICD-10-CM | POA: Insufficient documentation

## 2014-05-23 DIAGNOSIS — D129 Benign neoplasm of anus and anal canal: Secondary | ICD-10-CM

## 2014-05-23 DIAGNOSIS — K5730 Diverticulosis of large intestine without perforation or abscess without bleeding: Secondary | ICD-10-CM

## 2014-05-23 DIAGNOSIS — I442 Atrioventricular block, complete: Secondary | ICD-10-CM | POA: Insufficient documentation

## 2014-05-23 DIAGNOSIS — Z1211 Encounter for screening for malignant neoplasm of colon: Secondary | ICD-10-CM

## 2014-05-23 DIAGNOSIS — K621 Rectal polyp: Secondary | ICD-10-CM

## 2014-05-23 HISTORY — PX: COLONOSCOPY: SHX5424

## 2014-05-23 SURGERY — COLONOSCOPY
Anesthesia: Moderate Sedation

## 2014-05-23 MED ORDER — MIDAZOLAM HCL 5 MG/5ML IJ SOLN
INTRAMUSCULAR | Status: AC
  Filled 2014-05-23: qty 10

## 2014-05-23 MED ORDER — ONDANSETRON HCL 4 MG/2ML IJ SOLN
INTRAMUSCULAR | Status: AC
  Filled 2014-05-23: qty 2

## 2014-05-23 MED ORDER — ONDANSETRON HCL 4 MG/2ML IJ SOLN
INTRAMUSCULAR | Status: DC | PRN
  Administered 2014-05-23: 4 mg via INTRAVENOUS

## 2014-05-23 MED ORDER — SODIUM CHLORIDE 0.9 % IV SOLN
INTRAVENOUS | Status: DC
  Administered 2014-05-23: 14:00:00 via INTRAVENOUS

## 2014-05-23 MED ORDER — MEPERIDINE HCL 100 MG/ML IJ SOLN
INTRAMUSCULAR | Status: AC
  Filled 2014-05-23: qty 2

## 2014-05-23 MED ORDER — STERILE WATER FOR IRRIGATION IR SOLN
Status: DC | PRN
  Administered 2014-05-23: 15:00:00

## 2014-05-23 MED ORDER — MEPERIDINE HCL 100 MG/ML IJ SOLN
INTRAMUSCULAR | Status: DC | PRN
  Administered 2014-05-23: 25 mg via INTRAVENOUS
  Administered 2014-05-23: 50 mg via INTRAVENOUS

## 2014-05-23 MED ORDER — MIDAZOLAM HCL 5 MG/5ML IJ SOLN
INTRAMUSCULAR | Status: DC | PRN
  Administered 2014-05-23: 1 mg via INTRAVENOUS
  Administered 2014-05-23 (×2): 2 mg via INTRAVENOUS

## 2014-05-23 NOTE — Op Note (Signed)
[facility] [address] [place], [address]   COLONOSCOPY PROCEDURE REPORT  PATIENT: Seth, [name]  MR#:         [id number] BIRTHDATE: 20-Apr-1935 , 42  yrs. old GENDER: Male ENDOSCOPIST: R.  Garfield Cornea, MD FACP FACG REFERRED BY:  [name], M.D. PROCEDURE DATE:  05/23/2014 PROCEDURE:     Colonoscopy with biopsy  INDICATIONS: Average risk colorectal cancer screening examination  INFORMED CONSENT:  The risks, benefits, alternatives and imponderables including but not limited to bleeding, perforation as well as the possibility of a missed lesion have been reviewed.  The potential for biopsy, lesion removal, etc. have also been discussed.  Questions have been answered.  All parties agreeable. Please see the history and physical in the medical record for more information.  MEDICATIONS: Versed 5 mg IV and Demerol 75 mg IV in divided doses. Zofran 4 mg IV.  DESCRIPTION OF PROCEDURE:  After a digital rectal exam was performed, the EC-3890Li ([id number])  colonoscope was advanced from the anus through the rectum and colon to the area of the cecum, ileocecal valve and appendiceal orifice.  The cecum was deeply intubated.  These structures were well-seen and photographed for the record.  From the level of the cecum and ileocecal valve, the scope was slowly and cautiously withdrawn.  The mucosal surfaces were carefully surveyed utilizing scope tip deflection to facilitate fold flattening as needed.  The scope was pulled down into the rectum where a thorough examination including retroflexion was performed.    FINDINGS:  Adequate preparation. (1) 4 mm polyp in the rectum at 10 cm from the anal verge; otherwise, the remainder of the colonic mucosa appeared normal. Sigmoid diverticula; the remainder of the colonic mucosa appeared normal.  THERAPEUTIC / DIAGNOSTIC MANEUVERS PERFORMED:  The above-mentioned rectal polyp was cold  biopsied/removed  COMPLICATIONS: None  CECAL WITHDRAWAL TIME:  7 minutes  IMPRESSION:  Rectal polyp-removed as described above. Colonic diverticulosis.  RECOMMENDATIONS: Followup on pathology.   _______________________________ eSigned:  R. Garfield Cornea, MD FACP [facility] 05/23/2014 3:40 PM   CC:    PATIENT NAME:  Seth, [name] MR#: [id number]
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
## 2014-05-23 NOTE — Discharge Instructions (Addendum)
Colonoscopy Discharge Instructions  Read the instructions outlined below and refer to this sheet in the next few weeks. These discharge instructions provide you with general information on caring for yourself after you leave the hospital. Your doctor may also give you specific instructions. While your treatment has been planned according to the most current medical practices available, unavoidable complications occasionally occur. If you have any problems or questions after discharge, call Dr. Gala Romney at [phone]. ACTIVITY  You may resume your regular activity, but move at a slower pace for the next 24 hours.   Take frequent rest periods for the next 24 hours.   Walking will help get rid of the air and reduce the bloated feeling in your belly (abdomen).   No driving for 24 hours (because of the medicine (anesthesia) used during the test).    Do not sign any important legal documents or operate any machinery for 24 hours (because of the anesthesia used during the test).  NUTRITION  Drink plenty of fluids.   You may resume your normal diet as instructed by your doctor.   Begin with a light meal and progress to your normal diet. Heavy or fried foods are harder to digest and may make you feel sick to your stomach (nauseated).   Avoid alcoholic beverages for 24 hours or as instructed.  MEDICATIONS  You may resume your normal medications unless your doctor tells you otherwise.  WHAT YOU CAN EXPECT TODAY  Some feelings of bloating in the abdomen.   Passage of more gas than usual.   Spotting of blood in your stool or on the toilet paper.  IF YOU HAD POLYPS REMOVED DURING THE COLONOSCOPY:  No aspirin products for 7 days or as instructed.   No alcohol for 7 days or as instructed.   Eat a soft diet for the next 24 hours.  FINDING OUT THE RESULTS OF YOUR TEST Not all test results are available during your visit. If your test results are not back during the visit, make an appointment  with your caregiver to find out the results. Do not assume everything is normal if you have not heard from your caregiver or the medical facility. It is important for you to follow up on all of your test results.  SEEK IMMEDIATE MEDICAL ATTENTION IF:  You have more than a spotting of blood in your stool.   Your belly is swollen (abdominal distention).   You are nauseated or vomiting.   You have a temperature over 101.   You have abdominal pain or discomfort that is severe or gets worse throughout the day.     Polyp and diverticulosis information provided  Further recommendations to follow pending review of pathology report Diverticulitis A diverticulum is a small pouch or sac on the colon. Diverticulosis is the presence of these diverticula on the colon. Diverticulitis is the irritation (inflammation) or infection of diverticula. CAUSES  The colon and its diverticula contain bacteria. If food particles block the tiny opening to a diverticulum, the bacteria inside can grow and cause an increase in pressure. This leads to infection and inflammation and is called diverticulitis. SYMPTOMS   Abdominal pain and tenderness. Usually, the pain is located on the left side of your abdomen. However, it could be located elsewhere.  Fever.  Bloating.  Feeling sick to your stomach (nausea).  Throwing up (vomiting).  Abnormal stools. DIAGNOSIS  Your caregiver will take a history and perform a physical exam. Since many things can cause abdominal pain,  other tests may be necessary. Tests may include:  Blood tests.  Urine tests.  X-ray of the abdomen.  CT scan of the abdomen. Sometimes, surgery is needed to determine if diverticulitis or other conditions are causing your symptoms. TREATMENT  Most of the time, you can be treated without surgery. Treatment includes:  Resting the bowels by only having liquids for a few days. As you improve, you will need to eat a low-fiber  diet.  Intravenous (IV) fluids if you are losing body fluids (dehydrated).  Antibiotic medicines that treat infections may be given.  Pain and nausea medicine, if needed.  Surgery if the inflamed diverticulum has burst. HOME CARE INSTRUCTIONS   Try a clear liquid diet (broth, tea, or water for as long as directed by your caregiver). You may then gradually begin a low-fiber diet as tolerated.  A low-fiber diet is a diet with less than 10 grams of fiber. Choose the foods below to reduce fiber in the diet:  White breads, cereals, rice, and pasta.  Cooked fruits and vegetables or soft fresh fruits and vegetables without the skin.  Ground or well-cooked tender beef, ham, veal, lamb, pork, or poultry.  Eggs and seafood.  After your diverticulitis symptoms have improved, your caregiver may put you on a high-fiber diet. A high-fiber diet includes 14 grams of fiber for every 1000 calories consumed. For a standard 2000 calorie diet, you would need 28 grams of fiber. Follow these diet guidelines to help you increase the fiber in your diet. It is important to slowly increase the amount fiber in your diet to avoid gas, constipation, and bloating.  Choose whole-grain breads, cereals, pasta, and brown rice.  Choose fresh fruits and vegetables with the skin on. Do not overcook vegetables because the more vegetables are cooked, the more fiber is lost.  Choose more nuts, seeds, legumes, dried peas, beans, and lentils.  Look for food products that have greater than 3 grams of fiber per serving on the Nutrition Facts label.  Take all medicine as directed by your caregiver.  If your caregiver has given you a follow-up appointment, it is very important that you go. Not going could result in lasting (chronic) or permanent injury, pain, and disability. If there is any problem keeping the appointment, call to reschedule. SEEK MEDICAL CARE IF:   Your pain does not improve.  You have a hard time  advancing your diet beyond clear liquids.  Your bowel movements do not return to normal. SEEK IMMEDIATE MEDICAL CARE IF:   Your pain becomes worse.  You have an oral temperature above 102 F (38.9 C), not controlled by medicine.  You have repeated vomiting.  You have bloody or black, tarry stools.  Symptoms that brought you to your caregiver become worse or are not getting better. MAKE SURE YOU:   Understand these instructions.  Will watch your condition.  Will get help right away if you are not doing well or get worse.  Colonoscopy, Care After Refer to this sheet in the next few weeks. These instructions provide you with information on caring for yourself after your procedure. Your health care provider may also give you more specific instructions. Your treatment has been planned according to current medical practices, but problems sometimes occur. Call your health care provider if you have any problems or questions after your procedure. WHAT TO EXPECT AFTER THE PROCEDURE  After your procedure, it is typical to have the following: A small amount of blood in your stool.  Moderate amounts of gas and mild abdominal cramping or bloating. HOME CARE INSTRUCTIONS Do not drive, operate machinery, or sign important documents for 24 hours. You may shower and resume your regular physical activities, but move at a slower pace for the first 24 hours. Take frequent rest periods for the first 24 hours. Walk around or put a warm pack on your abdomen to help reduce abdominal cramping and bloating. Drink enough fluids to keep your urine clear or pale yellow. You may resume your normal diet as instructed by your health care provider. Avoid heavy or fried foods that are hard to digest. Avoid drinking alcohol for 24 hours or as instructed by your health care provider. Only take over-the-counter or prescription medicines as directed by your health care provider. If a tissue sample (biopsy) was taken  during your procedure: Do not take aspirin or blood thinners for 7 days, or as instructed by your health care provider. Do not drink alcohol for 7 days, or as instructed by your health care provider. Eat soft foods for the first 24 hours. SEEK MEDICAL CARE IF: You have persistent spotting of blood in your stool 2 3 days after the procedure. SEEK IMMEDIATE MEDICAL CARE IF: You have more than a small spotting of blood in your stool. You pass large blood clots in your stool. Your abdomen is swollen (distended). You have nausea or vomiting. You have a fever. You have increasing abdominal pain that is not relieved with medicine.

## 2014-05-24 ENCOUNTER — Encounter (HOSPITAL_COMMUNITY): Payer: Self-pay | Admitting: Internal Medicine

## 2014-05-26 ENCOUNTER — Encounter: Payer: Self-pay | Admitting: Internal Medicine

## 2014-06-19 ENCOUNTER — Ambulatory Visit (INDEPENDENT_AMBULATORY_CARE_PROVIDER_SITE_OTHER): Payer: Medicare Other | Admitting: *Deleted

## 2014-06-19 DIAGNOSIS — I442 Atrioventricular block, complete: Secondary | ICD-10-CM

## 2014-06-19 NOTE — Progress Notes (Signed)
Remote pacemaker transmission.   

## 2014-06-29 LAB — MDC_IDC_ENUM_SESS_TYPE_REMOTE
Battery Voltage: 2.78 V
Brady Statistic AP VS Percent: 0.1 % — CL
Brady Statistic AS VS Percent: 0.1 % — CL
Lead Channel Impedance Value: 481 Ohm
Lead Channel Impedance Value: 553 Ohm
Lead Channel Pacing Threshold Amplitude: 0.75 V
Lead Channel Pacing Threshold Pulse Width: 0.4 ms
Lead Channel Pacing Threshold Pulse Width: 0.4 ms
Lead Channel Sensing Intrinsic Amplitude: 2.8 mV
Lead Channel Setting Pacing Amplitude: 2 V
Lead Channel Setting Sensing Sensitivity: 2.8 mV
MDC IDC MSMT LEADCHNL RV PACING THRESHOLD AMPLITUDE: 0.875 V
MDC IDC SET LEADCHNL RA PACING AMPLITUDE: 1.5 V
MDC IDC SET LEADCHNL RV PACING PULSEWIDTH: 0.4 ms
MDC IDC STAT BRADY AP VP PERCENT: 63.5 %
MDC IDC STAT BRADY AS VP PERCENT: 36.5 %

## 2014-07-10 ENCOUNTER — Encounter: Payer: Self-pay | Admitting: Cardiology

## 2014-08-16 ENCOUNTER — Encounter: Payer: Self-pay | Admitting: Internal Medicine

## 2014-09-19 ENCOUNTER — Inpatient Hospital Stay: Payer: Self-pay | Admitting: Psychiatry

## 2014-09-19 LAB — COMPREHENSIVE METABOLIC PANEL
ALBUMIN: 4.4 g/dL (ref 3.4–5.0)
ANION GAP: 7 (ref 7–16)
Alkaline Phosphatase: 67 U/L
BUN: 13 mg/dL (ref 7–18)
Bilirubin,Total: 0.5 mg/dL (ref 0.2–1.0)
CHLORIDE: 111 mmol/L — AB (ref 98–107)
CO2: 26 mmol/L (ref 21–32)
Calcium, Total: 9.4 mg/dL (ref 8.5–10.1)
Creatinine: 1.04 mg/dL (ref 0.60–1.30)
EGFR (Non-African Amer.): >60
GLUCOSE: 95 mg/dL (ref 65–99)
Osmolality: 287 (ref 275–301)
POTASSIUM: 4 mmol/L (ref 3.5–5.1)
SGOT(AST): 30 U/L (ref 15–37)
SGPT (ALT): 24 U/L
Sodium: 144 mmol/L (ref 136–145)
Total Protein: 7.5 g/dL (ref 6.4–8.2)

## 2014-09-19 LAB — ETHANOL: ETHANOL LVL: 241 mg/dL

## 2014-09-19 LAB — CBC
HCT: 40.6 % (ref 40.0–52.0)
HGB: 13.5 g/dL (ref 13.0–18.0)
MCH: 33.9 pg (ref 26.0–34.0)
MCHC: 33.2 g/dL (ref 32.0–36.0)
MCV: 102 fL — ABNORMAL HIGH (ref 80–100)
Platelet: 154 10*3/uL (ref 150–440)
RBC: 3.97 10*6/uL — ABNORMAL LOW (ref 4.40–5.90)
RDW: 14.5 % (ref 11.5–14.5)
WBC: 7 10*3/uL (ref 3.8–10.6)

## 2014-09-19 LAB — DRUG SCREEN, URINE

## 2014-09-19 LAB — URINALYSIS, COMPLETE
BACTERIA: NONE SEEN
BILIRUBIN, UR: NEGATIVE
Blood: NEGATIVE
Glucose,UR: NEGATIVE mg/dL (ref 0–75)
Ketone: NEGATIVE
LEUKOCYTE ESTERASE: NEGATIVE
Nitrite: NEGATIVE
PROTEIN: NEGATIVE
Ph: 6 (ref 4.5–8.0)
RBC, UR: NONE SEEN /HPF (ref 0–5)
Specific Gravity: 1.004 (ref 1.003–1.030)
Squamous Epithelial: <1
WBC UR: NONE SEEN /HPF (ref 0–5)

## 2014-09-19 LAB — ACETAMINOPHEN LEVEL: Acetaminophen: <2 ug/mL

## 2014-09-19 LAB — SALICYLATE LEVEL: SALICYLATES, SERUM: 2 mg/dL

## 2014-09-20 ENCOUNTER — Telehealth: Payer: Self-pay | Admitting: Cardiology

## 2014-09-20 ENCOUNTER — Ambulatory Visit: Payer: Medicare Other | Admitting: *Deleted

## 2014-09-20 DIAGNOSIS — I442 Atrioventricular block, complete: Secondary | ICD-10-CM

## 2014-09-20 NOTE — Telephone Encounter (Signed)
LMOVM reminding pt to send remote transmission.   

## 2014-09-21 ENCOUNTER — Encounter: Payer: Self-pay | Admitting: Cardiology

## 2014-09-21 NOTE — Progress Notes (Signed)
Remote pacemaker transmission.   

## 2014-09-26 ENCOUNTER — Emergency Department (HOSPITAL_COMMUNITY)
Admission: EM | Admit: 2014-09-26 | Discharge: 2014-09-26 | Disposition: A | Discharge status: Home / Self Care | Payer: Medicare Other | Attending: Emergency Medicine | Admitting: Emergency Medicine

## 2014-09-26 ENCOUNTER — Encounter (HOSPITAL_COMMUNITY): Payer: Self-pay | Admitting: Emergency Medicine

## 2014-09-26 ENCOUNTER — Encounter: Payer: Medicare Other | Admitting: Internal Medicine

## 2014-09-26 DIAGNOSIS — Z79899 Other long term (current) drug therapy: Secondary | ICD-10-CM | POA: Insufficient documentation

## 2014-09-26 DIAGNOSIS — E785 Hyperlipidemia, unspecified: Secondary | ICD-10-CM | POA: Insufficient documentation

## 2014-09-26 DIAGNOSIS — Z8579 Personal history of other malignant neoplasms of lymphoid, hematopoietic and related tissues: Secondary | ICD-10-CM | POA: Diagnosis not present

## 2014-09-26 DIAGNOSIS — Z950 Presence of cardiac pacemaker: Secondary | ICD-10-CM | POA: Diagnosis not present

## 2014-09-26 DIAGNOSIS — F10120 Alcohol abuse with intoxication, uncomplicated: Secondary | ICD-10-CM | POA: Diagnosis present

## 2014-09-26 DIAGNOSIS — Z7982 Long term (current) use of aspirin: Secondary | ICD-10-CM | POA: Diagnosis not present

## 2014-09-26 DIAGNOSIS — I10 Essential (primary) hypertension: Secondary | ICD-10-CM | POA: Insufficient documentation

## 2014-09-26 DIAGNOSIS — I2510 Atherosclerotic heart disease of native coronary artery without angina pectoris: Secondary | ICD-10-CM | POA: Diagnosis not present

## 2014-09-26 DIAGNOSIS — Z87891 Personal history of nicotine dependence: Secondary | ICD-10-CM | POA: Insufficient documentation

## 2014-09-26 DIAGNOSIS — F1010 Alcohol abuse, uncomplicated: Secondary | ICD-10-CM | POA: Insufficient documentation

## 2014-09-26 LAB — COMPREHENSIVE METABOLIC PANEL
ALT: 19 U/L (ref 0–53)
ANION GAP: 14 (ref 5–15)
AST: 23 U/L (ref 0–37)
Albumin: 4.3 g/dL (ref 3.5–5.2)
Alkaline Phosphatase: 71 U/L (ref 39–117)
BILIRUBIN TOTAL: 0.4 mg/dL (ref 0.3–1.2)
BUN: 15 mg/dL (ref 6–23)
CALCIUM: 9.7 mg/dL (ref 8.4–10.5)
CO2: 26 meq/L (ref 19–32)
CREATININE: 0.97 mg/dL (ref 0.50–1.35)
Chloride: 105 mEq/L (ref 96–112)
GFR calc non Af Amer: 78 mL/min — ABNORMAL LOW (ref >90)
GFR, EST AFRICAN AMERICAN: 90 mL/min — AB (ref >90)
GLUCOSE: 129 mg/dL — AB (ref 70–99)
Potassium: 4.2 mEq/L (ref 3.7–5.3)
Sodium: 145 mEq/L (ref 137–147)
Total Protein: 7.2 g/dL (ref 6.0–8.3)

## 2014-09-26 LAB — CBC WITH DIFFERENTIAL/PLATELET
BASOS ABS: 0 10*3/uL (ref 0.0–0.1)
Basophils Relative: 0 % (ref 0–1)
EOS PCT: 1 % (ref 0–5)
Eosinophils Absolute: 0.1 10*3/uL (ref 0.0–0.7)
HCT: 37.5 % — ABNORMAL LOW (ref 39.0–52.0)
HEMOGLOBIN: 13.4 g/dL (ref 13.0–17.0)
LYMPHS PCT: 23 % (ref 12–46)
Lymphs Abs: 1.2 10*3/uL (ref 0.7–4.0)
MCH: 34.9 pg — ABNORMAL HIGH (ref 26.0–34.0)
MCHC: 35.7 g/dL (ref 30.0–36.0)
MCV: 97.7 fL (ref 78.0–100.0)
MONO ABS: 0.5 10*3/uL (ref 0.1–1.0)
MONOS PCT: 9 % (ref 3–12)
Neutro Abs: 3.5 10*3/uL (ref 1.7–7.7)
Neutrophils Relative %: 67 % (ref 43–77)
Platelets: 174 10*3/uL (ref 150–400)
RBC: 3.84 MIL/uL — ABNORMAL LOW (ref 4.22–5.81)
RDW: 14.2 % (ref 11.5–15.5)
WBC: 5.2 10*3/uL (ref 4.0–10.5)

## 2014-09-26 LAB — ETHANOL: ALCOHOL ETHYL (B): 254 mg/dL — AB (ref 0–11)

## 2014-09-26 LAB — RAPID URINE DRUG SCREEN, HOSP PERFORMED
Amphetamines: NOT DETECTED
Barbiturates: NOT DETECTED
Benzodiazepines: NOT DETECTED
Cocaine: NOT DETECTED
OPIATES: NOT DETECTED
Tetrahydrocannabinol: NOT DETECTED

## 2014-09-26 NOTE — Discharge Instructions (Signed)
Follow up with your family md this week °

## 2014-09-26 NOTE — ED Notes (Signed)
Pt states he is an alcoholic, "I am drunk, I love my family,but I can not beat this"  Pt has been drinking a lot more for the last 4 days.  Per wife has drank 1/5 in the last 12 hours.

## 2014-09-26 NOTE — ED Notes (Signed)
Pt was in  [facility] last week for suicide ideation.
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
## 2014-09-26 NOTE — ED Notes (Signed)
Per wife in the last couple of weeks, they have made some changes in stock market and lost money and he has been very upset over money matters.

## 2014-09-26 NOTE — ED Notes (Signed)
Gave him a frozen food tray.

## 2014-10-18 ENCOUNTER — Encounter (HOSPITAL_COMMUNITY): Payer: Self-pay | Admitting: Emergency Medicine

## 2014-10-18 ENCOUNTER — Emergency Department (HOSPITAL_COMMUNITY)
Admission: EM | Admit: 2014-10-18 | Discharge: 2014-10-18 | Disposition: A | Discharge status: Home / Self Care | Payer: Medicare Other | Attending: Emergency Medicine | Admitting: Emergency Medicine

## 2014-10-18 ENCOUNTER — Emergency Department (HOSPITAL_COMMUNITY): Payer: Medicare Other

## 2014-10-18 DIAGNOSIS — Z87828 Personal history of other (healed) physical injury and trauma: Secondary | ICD-10-CM | POA: Diagnosis not present

## 2014-10-18 DIAGNOSIS — I2510 Atherosclerotic heart disease of native coronary artery without angina pectoris: Secondary | ICD-10-CM | POA: Diagnosis not present

## 2014-10-18 DIAGNOSIS — Z950 Presence of cardiac pacemaker: Secondary | ICD-10-CM | POA: Diagnosis not present

## 2014-10-18 DIAGNOSIS — R109 Unspecified abdominal pain: Secondary | ICD-10-CM | POA: Diagnosis not present

## 2014-10-18 DIAGNOSIS — Z7982 Long term (current) use of aspirin: Secondary | ICD-10-CM | POA: Diagnosis not present

## 2014-10-18 DIAGNOSIS — Z79899 Other long term (current) drug therapy: Secondary | ICD-10-CM | POA: Diagnosis not present

## 2014-10-18 DIAGNOSIS — Z87891 Personal history of nicotine dependence: Secondary | ICD-10-CM | POA: Diagnosis not present

## 2014-10-18 DIAGNOSIS — Z8572 Personal history of non-Hodgkin lymphomas: Secondary | ICD-10-CM | POA: Insufficient documentation

## 2014-10-18 DIAGNOSIS — K5660 Unspecified intestinal obstruction: Secondary | ICD-10-CM | POA: Diagnosis present

## 2014-10-18 DIAGNOSIS — I10 Essential (primary) hypertension: Secondary | ICD-10-CM | POA: Insufficient documentation

## 2014-10-18 DIAGNOSIS — E785 Hyperlipidemia, unspecified: Secondary | ICD-10-CM | POA: Diagnosis not present

## 2014-10-18 LAB — CBC WITH DIFFERENTIAL/PLATELET
Basophils Absolute: 0 10*3/uL (ref 0.0–0.1)
Basophils Relative: 1 % (ref 0–1)
EOS ABS: 0.1 10*3/uL (ref 0.0–0.7)
EOS PCT: 2 % (ref 0–5)
HEMATOCRIT: 40.6 % (ref 39.0–52.0)
HEMOGLOBIN: 14 g/dL (ref 13.0–17.0)
LYMPHS ABS: 1.2 10*3/uL (ref 0.7–4.0)
Lymphocytes Relative: 20 % (ref 12–46)
MCH: 35 pg — AB (ref 26.0–34.0)
MCHC: 34.5 g/dL (ref 30.0–36.0)
MCV: 101.5 fL — AB (ref 78.0–100.0)
MONOS PCT: 8 % (ref 3–12)
Monocytes Absolute: 0.5 10*3/uL (ref 0.1–1.0)
Neutro Abs: 4.2 10*3/uL (ref 1.7–7.7)
Neutrophils Relative %: 69 % (ref 43–77)
Platelets: 141 10*3/uL — ABNORMAL LOW (ref 150–400)
RBC: 4 MIL/uL — ABNORMAL LOW (ref 4.22–5.81)
RDW: 14.6 % (ref 11.5–15.5)
WBC: 6 10*3/uL (ref 4.0–10.5)

## 2014-10-18 LAB — COMPREHENSIVE METABOLIC PANEL
ALT: 17 U/L (ref 0–53)
AST: 23 U/L (ref 0–37)
Albumin: 4.5 g/dL (ref 3.5–5.2)
Alkaline Phosphatase: 58 U/L (ref 39–117)
Anion gap: 12 (ref 5–15)
BUN: 15 mg/dL (ref 6–23)
CHLORIDE: 101 meq/L (ref 96–112)
CO2: 28 meq/L (ref 19–32)
CREATININE: 0.97 mg/dL (ref 0.50–1.35)
Calcium: 10.6 mg/dL — ABNORMAL HIGH (ref 8.4–10.5)
GFR, EST AFRICAN AMERICAN: 90 mL/min — AB (ref >90)
GFR, EST NON AFRICAN AMERICAN: 78 mL/min — AB (ref >90)
Glucose, Bld: 89 mg/dL (ref 70–99)
Potassium: 4.5 mEq/L (ref 3.7–5.3)
Sodium: 141 mEq/L (ref 137–147)
Total Bilirubin: 0.7 mg/dL (ref 0.3–1.2)
Total Protein: 7.5 g/dL (ref 6.0–8.3)

## 2014-10-18 LAB — LIPASE, BLOOD: Lipase: 28 U/L (ref 11–59)

## 2014-10-18 MED ORDER — IOHEXOL 300 MG/ML  SOLN
100.0000 mL | Freq: Once | INTRAMUSCULAR | Status: AC | PRN
  Administered 2014-10-18: 100 mL via INTRAVENOUS

## 2014-10-18 MED ORDER — SODIUM CHLORIDE 0.9 % IV BOLUS (SEPSIS)
250.0000 mL | Freq: Once | INTRAVENOUS | Status: AC
  Administered 2014-10-18: 250 mL via INTRAVENOUS

## 2014-10-18 MED ORDER — ONDANSETRON HCL 4 MG/2ML IJ SOLN
4.0000 mg | Freq: Once | INTRAMUSCULAR | Status: AC
  Administered 2014-10-18: 4 mg via INTRAVENOUS
  Filled 2014-10-18: qty 2

## 2014-10-18 MED ORDER — IOHEXOL 300 MG/ML  SOLN: 25.0000 mL | Freq: Once | INTRAMUSCULAR | Status: DC | PRN

## 2014-10-18 MED ORDER — SODIUM CHLORIDE 0.9 % IV SOLN
INTRAVENOUS | Status: DC
  Administered 2014-10-18: 21:00:00 via INTRAVENOUS
  Administered 2014-10-18: 25 mL via INTRAVENOUS

## 2014-10-18 NOTE — ED Provider Notes (Signed)
MSE was initiated and I personally evaluated the patient and placed orders (if any) at  5:42 PM on October 18, 2014.  The patient appears stable so that the remainder of the MSE may be completed by another provider.  I was called about this patient from an outside urgent care for possible small bowel obstruction based on plain film with air-fluid levels.  The patient reports that his abdominal pain is somewhat better at this time.  Patient will be triaged and brought back to an examination room.  Overall well-appearing and nontoxic appearing.  Hoy Morn, MD 10/18/14 [phone]

## 2014-10-18 NOTE — Discharge Instructions (Signed)
Workup here today including labs and CAT scan without any significant findings in the abdomen. However if symptoms persist it would be appropriate to get a colonoscopy done. You can get that arranged by following up with your regular Dr. Return for any new or worse symptoms.

## 2014-10-18 NOTE — ED Notes (Signed)
Returned from ct 
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
## 2014-10-18 NOTE — ED Notes (Signed)
Pt drank po ct contrast

## 2014-10-18 NOTE — ED Notes (Signed)
Pt here for bowel obstruction. Has been having abd pain since Monday and Tuesday. Had a plain film today and showed a blockage. Feels a little nauseated but no vomiting.

## 2014-10-19 ENCOUNTER — Encounter: Payer: Self-pay | Admitting: Internal Medicine

## 2014-10-19 ENCOUNTER — Ambulatory Visit (INDEPENDENT_AMBULATORY_CARE_PROVIDER_SITE_OTHER): Payer: Medicare Other | Admitting: Internal Medicine

## 2014-10-19 VITALS — BP 160/85 | HR 70 | Ht 68.0 in | Wt 191.0 lb

## 2014-10-19 DIAGNOSIS — I4892 Unspecified atrial flutter: Secondary | ICD-10-CM

## 2014-10-19 DIAGNOSIS — Z950 Presence of cardiac pacemaker: Secondary | ICD-10-CM

## 2014-10-19 DIAGNOSIS — I442 Atrioventricular block, complete: Secondary | ICD-10-CM

## 2014-10-19 DIAGNOSIS — I10 Essential (primary) hypertension: Secondary | ICD-10-CM

## 2014-10-19 LAB — MDC_IDC_ENUM_SESS_TYPE_INCLINIC
Battery Impedance: 280 Ohm
Brady Statistic AP VP Percent: 61 %
Brady Statistic AP VS Percent: 0 %
Brady Statistic AS VS Percent: 0 %
Date Time Interrogation Session: 20151030151717
Lead Channel Impedance Value: 552 Ohm
Lead Channel Pacing Threshold Amplitude: 0.75 V
Lead Channel Pacing Threshold Pulse Width: 0.4 ms
Lead Channel Pacing Threshold Pulse Width: 0.4 ms
Lead Channel Sensing Intrinsic Amplitude: 2.8 mV
Lead Channel Setting Pacing Amplitude: 2.5 V
Lead Channel Setting Sensing Sensitivity: 2.8 mV
MDC IDC MSMT BATTERY REMAINING LONGEVITY: 98 mo
MDC IDC MSMT BATTERY VOLTAGE: 2.78 V
MDC IDC MSMT LEADCHNL RA IMPEDANCE VALUE: 495 Ohm
MDC IDC MSMT LEADCHNL RV PACING THRESHOLD AMPLITUDE: 1 V
MDC IDC SET LEADCHNL RA PACING AMPLITUDE: 2 V
MDC IDC SET LEADCHNL RV PACING PULSEWIDTH: 0.4 ms
MDC IDC STAT BRADY AS VP PERCENT: 39 %

## 2014-10-19 MED ORDER — LOSARTAN POTASSIUM 50 MG PO TABS: ORAL_TABLET | ORAL | Status: AC

## 2014-10-19 NOTE — Patient Instructions (Signed)
Remote monitoring is used to monitor your Pacemaker or ICD from home. This monitoring reduces the number of office visits required to check your device to one time per year. It allows [place] to keep an eye on the functioning of your device to ensure it is working properly. You are scheduled for a device check from home on 01/21/14. You may send your transmission at any time that day. If you have a wireless device, the transmission will be sent automatically. After your physician reviews your transmission, you will receive a postcard with your next transmission date.   Your physician wants you to follow-up in: 12 months with Dr Vallery Ridge will receive a reminder letter in the mail two months in advance. If you don't receive a letter, please call our office to schedule the follow-up appointment.  Your physician has recommended you make the following change in your medication:  1) Increase Losartan to 75mg  daily
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
## 2015-01-21 ENCOUNTER — Telehealth: Payer: Self-pay | Admitting: Cardiology

## 2015-01-21 ENCOUNTER — Ambulatory Visit (INDEPENDENT_AMBULATORY_CARE_PROVIDER_SITE_OTHER): Payer: Medicare Other | Admitting: *Deleted

## 2015-01-21 DIAGNOSIS — I442 Atrioventricular block, complete: Secondary | ICD-10-CM

## 2015-01-21 NOTE — Telephone Encounter (Signed)
Confirmed remote transmission w/ pt wife.   

## 2015-01-21 NOTE — Progress Notes (Signed)
Remote pacemaker transmission.   

## 2015-01-22 LAB — MDC_IDC_ENUM_SESS_TYPE_REMOTE
Battery Impedance: 305 Ohm
Battery Remaining Longevity: 97 mo
Battery Voltage: 2.79 V
Brady Statistic AP VP Percent: 45 %
Brady Statistic AS VP Percent: 55 %
Brady Statistic AS VS Percent: 0 %
Lead Channel Impedance Value: 448 Ohm
Lead Channel Impedance Value: 586 Ohm
Lead Channel Pacing Threshold Amplitude: 0.875 V
Lead Channel Setting Pacing Amplitude: 2.5 V
Lead Channel Setting Sensing Sensitivity: 2.8 mV
MDC IDC MSMT LEADCHNL RA PACING THRESHOLD AMPLITUDE: 0.75 V
MDC IDC MSMT LEADCHNL RA PACING THRESHOLD PULSEWIDTH: 0.4 ms
MDC IDC MSMT LEADCHNL RA SENSING INTR AMPL: 2.8 mV — AB
MDC IDC MSMT LEADCHNL RV PACING THRESHOLD PULSEWIDTH: 0.4 ms
MDC IDC SESS DTM: 20160201185929
MDC IDC SET LEADCHNL RA PACING AMPLITUDE: 2 V
MDC IDC SET LEADCHNL RV PACING PULSEWIDTH: 0.4 ms
MDC IDC STAT BRADY AP VS PERCENT: 0 %

## 2015-02-06 ENCOUNTER — Encounter: Payer: Self-pay | Admitting: Cardiology

## 2015-02-11 ENCOUNTER — Encounter: Payer: Self-pay | Admitting: Internal Medicine
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
## 2015-04-13 NOTE — Consult Note (Signed)
PATIENT NAME:  [name], [name] MR#:  [id number] DATE OF BIRTH:  Feb 11, 1935  DATE OF CONSULTATION:  09/20/2014  REFERRING PHYSICIAN:  Hildred Priest, MD  CONSULTING PHYSICIAN:  Earleen Newport. Volanda Napoleon, MD  PRIMARY CARE PHYSICIAN:  Dr. Maeola Harman in [place].  REASON FOR CONSULTATION:  Uncontrolled hypertension.   HISTORY OF PRESENT ILLNESS: This very pleasant 80 year old man with past medical history of hypertension, hyperlipidemia, symptomatic bradycardia status post pacemaker placement, alcohol abuse and depression with suicidal ideation presented initially to [facility] on September 30 with suicidal and homicidal statements. He is currently in the behavioral health unit. He is undergoing treatment for alcohol withdrawal as well as depression with suicidal ideation. He denies any headache, vision, change, chest pain, shortness of breath, or peripheral edema. He states that he has a known history of hypertension and is usually and is usually well controlled on losartan 50 mg orally in the outpatient setting.   PAST MEDICAL HISTORY:  1.  Hypertension.  2.  Hyperlipidemia.  3.  Symptomatic bradycardia status post pacemaker placement   PAST SURGICAL HISTORY:  1.  Pacemaker placement.  2.  Hernia repair.   SOCIAL HISTORY: He currently lives with his wife. He has adult children that do not live with them. 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  He is currently retired. He reports that he has never been a cigarette smoker. Does not use illicit substances. He does drink alcohol in excess.   ALLERGIES: NO KNOWN DRUG ALLERGIES.   HOME MEDICATIONS:  1.  Losartan 50 mg 1 tablet daily.  2.  Simvastatin 40 mg daily.  3.  Aspirin 81 mg daily.   FAMILY HISTORY:  He reports that his father had coronary artery disease and died of a myocardial infarction. He reports that his mother had a CVA. Reports that his sister died in her 70s of cancer in liver.   REVIEW OF SYSTEMS: As  noted above, no headache, vision, change, chest pain, shortness of breath, peripheral edema. No palpitations or weakness. No focal numbness or confusion.   PHYSICAL EXAMINATION:  VITAL SIGNS: Temperature 98.3, heart rate 92. Blood pressure 170/78, oxygenation 98% on room air.  GENERAL: Alert, oriented, in no acute distress.  HEENT: Pupils are equal, round, and reactive to light. Conjunctivae are clear, extraocular motion is intact. Good dentition.  Oropharynx is clear. Oral mucous membranes are pink and moist.  RESPIRATORY: Lungs are clear to auscultation bilaterally with good air movement.  CARDIOVASCULAR: Regular rate and rhythm, no murmurs, rubs, or gallops, no peripheral edema, 2+ peripheral pulses.  ABDOMEN: Soft, nontender, nondistended. Bowel sounds are normal. No guarding, no rebound, no hepatosplenomegaly.  EXTREMITIES: Moves all 4 extremities. Range of motion is normal. No swollen or tender joints, strength is 5 out of 5 throughout.  NEUROLOGIC: Cranial nerves II-XII are grossly intact. Sensation and strength are intact, nonfocal neurologic examination.  PSYCHIATRIC: The patient is alert and oriented. He seems to have poor insight into his current situation. He is very talkative, somewhat tangential.   LABORATORY DATA: Sodium 144, potassium 4.0, chloride 111, bicarbonate 26, BUN 13, creatinine 1.04, glucose 95. LFTs are normal. Serum ethanol on presentation is 241. Urine drug screen is negative. Hemoglobin 13.5, white blood cells 7.0, platelets 154,000, MCV 102.   Urinalysis shows no signs of infection, serum acetaminophen and salicylates are negative.   IMAGING: No imaging.   ASSESSMENT AND PLAN:  1.  Uncontrolled hypertension: Would resume home losartan as Dr. Doran Clay  has already done. Agree with hydralazine 50 mg oral every 6 hours as needed for systolic blood pressure over 170.  Would hold off on using clonidine scheduled, would rather use it p.r.n. for signs of  withdrawal. I will change this order. If blood pressure continues to be uncontrolled could add a diuretic such as hydrochlorothiazide. I think starting CIWA protocol for withdrawal and resuming home medications should control his blood pressure very well.  2.  Alcohol withdrawal. Initiate CIWA protocol with Ativan for signs of withdrawal. Agree with multivitamin tablet.  3.  Hyperlipidemia: Continue statin. LFTs are normal.  4.  Nicotine addiction: Continue nicotine patch.   Time spent on consultation: 30 minutes.   I appreciate the opportunity to participate in the care of this patient. We will continue to follow with you.   ____________________________ Earleen Newport. Volanda Napoleon, MD cpw:lt D: 09/20/2014 15:02:30 ET T: 09/20/2014 15:29:24 ET JOB#: [id number]  cc: Barnetta Chapel P. Volanda Napoleon, MD, <Dictator> Aldean Jewett MD ELECTRONICALLY SIGNED 09/26/2014 13:33

## 2015-04-22 ENCOUNTER — Ambulatory Visit (INDEPENDENT_AMBULATORY_CARE_PROVIDER_SITE_OTHER): Payer: Medicare Other | Admitting: *Deleted

## 2015-04-22 ENCOUNTER — Telehealth: Payer: Self-pay | Admitting: Cardiology

## 2015-04-22 DIAGNOSIS — I442 Atrioventricular block, complete: Secondary | ICD-10-CM | POA: Diagnosis not present

## 2015-04-22 NOTE — Progress Notes (Signed)
Remote pacemaker transmission.   

## 2015-04-22 NOTE — Telephone Encounter (Signed)
Spoke with pt and reminded pt of remote transmission that is due today. Pt verbalized understanding.   

## 2015-04-26 LAB — CUP PACEART REMOTE DEVICE CHECK
Battery Impedance: 354 Ohm
Battery Remaining Longevity: 92 mo
Battery Voltage: 2.79 V
Brady Statistic AP VP Percent: 48 %
Brady Statistic AP VS Percent: 0 %
Date Time Interrogation Session: 20160502161902
Lead Channel Impedance Value: 473 Ohm
Lead Channel Pacing Threshold Pulse Width: 0.4 ms
Lead Channel Setting Pacing Amplitude: 2 V
Lead Channel Setting Pacing Amplitude: 2.5 V
Lead Channel Setting Pacing Pulse Width: 0.4 ms
Lead Channel Setting Sensing Sensitivity: 2.8 mV
MDC IDC MSMT LEADCHNL RA PACING THRESHOLD AMPLITUDE: 0.75 V
MDC IDC MSMT LEADCHNL RA PACING THRESHOLD PULSEWIDTH: 0.4 ms
MDC IDC MSMT LEADCHNL RA SENSING INTR AMPL: 2.8 mV
MDC IDC MSMT LEADCHNL RV IMPEDANCE VALUE: 571 Ohm
MDC IDC MSMT LEADCHNL RV PACING THRESHOLD AMPLITUDE: 0.875 V
MDC IDC STAT BRADY AS VP PERCENT: 52 %
MDC IDC STAT BRADY AS VS PERCENT: 0 %
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
## 2015-05-02 ENCOUNTER — Encounter: Payer: Self-pay | Admitting: Cardiology

## 2015-05-08 ENCOUNTER — Encounter: Payer: Self-pay | Admitting: Internal Medicine

## 2015-07-01 ENCOUNTER — Other Ambulatory Visit (HOSPITAL_COMMUNITY): Payer: Self-pay | Admitting: Internal Medicine

## 2015-07-01 ENCOUNTER — Ambulatory Visit (HOSPITAL_COMMUNITY)
Admission: RE | Admit: 2015-07-01 | Discharge: 2015-07-01 | Disposition: A | Discharge status: Home / Self Care | Payer: Medicare Other | Source: Ambulatory Visit | Attending: Internal Medicine | Admitting: Internal Medicine

## 2015-07-01 DIAGNOSIS — R079 Chest pain, unspecified: Secondary | ICD-10-CM

## 2015-07-01 DIAGNOSIS — R0781 Pleurodynia: Secondary | ICD-10-CM | POA: Insufficient documentation

## 2015-07-09 ENCOUNTER — Ambulatory Visit (HOSPITAL_COMMUNITY)
Admission: RE | Admit: 2015-07-09 | Discharge: 2015-07-09 | Disposition: A | Discharge status: Home / Self Care | Payer: Medicare Other | Source: Ambulatory Visit | Attending: Internal Medicine | Admitting: Internal Medicine

## 2015-07-09 ENCOUNTER — Other Ambulatory Visit (HOSPITAL_COMMUNITY): Payer: Self-pay | Admitting: Internal Medicine

## 2015-07-09 DIAGNOSIS — S2231XA Fracture of one rib, right side, initial encounter for closed fracture: Secondary | ICD-10-CM | POA: Diagnosis not present

## 2015-07-09 DIAGNOSIS — R0781 Pleurodynia: Secondary | ICD-10-CM | POA: Insufficient documentation

## 2015-07-09 DIAGNOSIS — R0789 Other chest pain: Secondary | ICD-10-CM

## 2015-07-09 DIAGNOSIS — K8020 Calculus of gallbladder without cholecystitis without obstruction: Secondary | ICD-10-CM | POA: Diagnosis not present

## 2015-07-09 DIAGNOSIS — I2510 Atherosclerotic heart disease of native coronary artery without angina pectoris: Secondary | ICD-10-CM | POA: Diagnosis not present

## 2015-07-09 DIAGNOSIS — X58XXXA Exposure to other specified factors, initial encounter: Secondary | ICD-10-CM | POA: Insufficient documentation

## 2015-07-11 ENCOUNTER — Other Ambulatory Visit (HOSPITAL_COMMUNITY): Payer: Medicare Other

## 2015-07-19 ENCOUNTER — Encounter: Payer: Self-pay | Admitting: Internal Medicine

## 2015-07-19 DIAGNOSIS — I442 Atrioventricular block, complete: Secondary | ICD-10-CM | POA: Diagnosis not present

## 2015-07-23 ENCOUNTER — Ambulatory Visit (INDEPENDENT_AMBULATORY_CARE_PROVIDER_SITE_OTHER): Payer: Medicare Other | Admitting: *Deleted

## 2015-07-23 ENCOUNTER — Telehealth: Payer: Self-pay | Admitting: Cardiology

## 2015-07-23 DIAGNOSIS — I442 Atrioventricular block, complete: Secondary | ICD-10-CM

## 2015-07-23 NOTE — Telephone Encounter (Signed)
Spoke with pt and reminded pt of remote transmission that is due today. Pt verbalized understanding.   

## 2015-07-24 NOTE — Progress Notes (Signed)
Remote pacemaker transmission.   

## 2015-08-03 LAB — CUP PACEART REMOTE DEVICE CHECK
Battery Impedance: 378 Ohm
Battery Remaining Longevity: 91 mo
Battery Voltage: 2.79 V
Brady Statistic AP VS Percent: 0 %
Date Time Interrogation Session: 20160729180320
Lead Channel Impedance Value: 582 Ohm
Lead Channel Pacing Threshold Pulse Width: 0.4 ms
Lead Channel Setting Pacing Amplitude: 2 V
Lead Channel Setting Pacing Amplitude: 2.5 V
Lead Channel Setting Pacing Pulse Width: 0.4 ms
MDC IDC MSMT LEADCHNL RA IMPEDANCE VALUE: 503 Ohm
MDC IDC MSMT LEADCHNL RA PACING THRESHOLD AMPLITUDE: 0.75 V
MDC IDC MSMT LEADCHNL RA SENSING INTR AMPL: 2.8 mV
MDC IDC MSMT LEADCHNL RV PACING THRESHOLD AMPLITUDE: 0.875 V
MDC IDC MSMT LEADCHNL RV PACING THRESHOLD PULSEWIDTH: 0.4 ms
MDC IDC SET LEADCHNL RV SENSING SENSITIVITY: 2.8 mV
MDC IDC STAT BRADY AP VP PERCENT: 51 %
MDC IDC STAT BRADY AS VP PERCENT: 49 %
MDC IDC STAT BRADY AS VS PERCENT: 0 %

## 2015-08-07 ENCOUNTER — Encounter: Payer: Self-pay | Admitting: Cardiology

## 2015-11-11 ENCOUNTER — Encounter: Payer: Self-pay | Admitting: Internal Medicine

## 2015-11-11 ENCOUNTER — Ambulatory Visit (INDEPENDENT_AMBULATORY_CARE_PROVIDER_SITE_OTHER): Payer: Medicare Other | Admitting: Internal Medicine

## 2015-11-11 VITALS — BP 130/68 | HR 84 | Ht 67.5 in | Wt 206.8 lb

## 2015-11-11 DIAGNOSIS — I442 Atrioventricular block, complete: Secondary | ICD-10-CM

## 2015-11-11 DIAGNOSIS — I4892 Unspecified atrial flutter: Secondary | ICD-10-CM | POA: Diagnosis not present

## 2015-11-11 DIAGNOSIS — I10 Essential (primary) hypertension: Secondary | ICD-10-CM

## 2015-11-11 DIAGNOSIS — Z950 Presence of cardiac pacemaker: Secondary | ICD-10-CM | POA: Diagnosis not present

## 2015-11-11 DIAGNOSIS — R0683 Snoring: Secondary | ICD-10-CM | POA: Diagnosis not present

## 2015-11-11 DIAGNOSIS — R5383 Other fatigue: Secondary | ICD-10-CM | POA: Diagnosis not present

## 2015-11-11 LAB — CUP PACEART INCLINIC DEVICE CHECK
Battery Remaining Longevity: 88 mo
Battery Voltage: 2.79 V
Brady Statistic AP VP Percent: 49 %
Brady Statistic AS VS Percent: 0 %
Date Time Interrogation Session: 20161121175026
Implantable Lead Implant Date: 20070802
Implantable Lead Location: 753859
Implantable Lead Model: 5076
Lead Channel Impedance Value: 555 Ohm
Lead Channel Pacing Threshold Amplitude: 0.75 V
Lead Channel Pacing Threshold Pulse Width: 0.4 ms
Lead Channel Sensing Intrinsic Amplitude: 4 mV
Lead Channel Setting Pacing Amplitude: 2 V
Lead Channel Setting Pacing Pulse Width: 0.4 ms
Lead Channel Setting Sensing Sensitivity: 2.8 mV
MDC IDC LEAD IMPLANT DT: 20111027
MDC IDC LEAD LOCATION: 753860
MDC IDC MSMT BATTERY IMPEDANCE: 403 Ohm
MDC IDC MSMT LEADCHNL RA IMPEDANCE VALUE: 526 Ohm
MDC IDC MSMT LEADCHNL RA PACING THRESHOLD AMPLITUDE: 0.75 V
MDC IDC MSMT LEADCHNL RA PACING THRESHOLD PULSEWIDTH: 0.4 ms
MDC IDC SET LEADCHNL RV PACING AMPLITUDE: 2.5 V
MDC IDC STAT BRADY AP VS PERCENT: 0 %
MDC IDC STAT BRADY AS VP PERCENT: 51 %
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
## 2015-11-11 NOTE — Patient Instructions (Signed)
Medication Instructions:  Your physician recommends that you continue on your current medications as directed. Please refer to the Current Medication list given to you today.   Labwork: None ordered   Testing/Procedures: None ordered   Follow-Up: Your physician wants you to follow-up in: 12 months with Chanetta Marshall, NP You will receive a reminder letter in the mail two months in advance. If you don't receive a letter, please call our office to schedule the follow-up appointment.  Remote monitoring is used to monitor your Pacemaker  from home. This monitoring reduces the number of office visits required to check your device to one time per year. It allows [place] to keep an eye on the functioning of your device to ensure it is working properly. You are scheduled for a device check from home on 02/10/16. You may send your transmission at any time that day. If you have a wireless device, the transmission will be sent automatically. After your physician reviews your transmission, you will receive a postcard with your next transmission date.  You have been referred to Dr Princella Ion sleep study      Any Other Special Instructions Will Be Listed Below (If Applicable).     If you need a refill on your cardiac medications before your next appointment, please call your pharmacy.

## 2015-11-27 ENCOUNTER — Emergency Department
Admission: EM | Admit: 2015-11-27 | Discharge: 2015-11-27 | Disposition: A | Discharge status: Home / Self Care | Payer: Medicare Other

## 2015-11-27 ENCOUNTER — Encounter: Payer: Self-pay | Admitting: *Deleted

## 2015-11-27 ENCOUNTER — Emergency Department
Admission: EM | Admit: 2015-11-27 | Discharge: 2015-11-27 | Disposition: A | Discharge status: Home / Self Care | Payer: Medicare Other | Attending: Emergency Medicine | Admitting: Emergency Medicine

## 2015-11-27 DIAGNOSIS — F1994 Other psychoactive substance use, unspecified with psychoactive substance-induced mood disorder: Secondary | ICD-10-CM

## 2015-11-27 DIAGNOSIS — I10 Essential (primary) hypertension: Secondary | ICD-10-CM | POA: Diagnosis not present

## 2015-11-27 DIAGNOSIS — R45851 Suicidal ideations: Secondary | ICD-10-CM | POA: Diagnosis not present

## 2015-11-27 DIAGNOSIS — Z7982 Long term (current) use of aspirin: Secondary | ICD-10-CM | POA: Diagnosis not present

## 2015-11-27 DIAGNOSIS — F10120 Alcohol abuse with intoxication, uncomplicated: Secondary | ICD-10-CM | POA: Diagnosis present

## 2015-11-27 DIAGNOSIS — Z87891 Personal history of nicotine dependence: Secondary | ICD-10-CM | POA: Insufficient documentation

## 2015-11-27 DIAGNOSIS — J069 Acute upper respiratory infection, unspecified: Secondary | ICD-10-CM

## 2015-11-27 DIAGNOSIS — F1020 Alcohol dependence, uncomplicated: Secondary | ICD-10-CM

## 2015-11-27 DIAGNOSIS — F10929 Alcohol use, unspecified with intoxication, unspecified: Secondary | ICD-10-CM

## 2015-11-27 DIAGNOSIS — Z79899 Other long term (current) drug therapy: Secondary | ICD-10-CM | POA: Insufficient documentation

## 2015-11-27 LAB — URINALYSIS COMPLETE WITH MICROSCOPIC (ARMC ONLY)
Bacteria, UA: NONE SEEN
Bilirubin Urine: NEGATIVE
Glucose, UA: NEGATIVE mg/dL
Hgb urine dipstick: NEGATIVE
Ketones, ur: NEGATIVE mg/dL
LEUKOCYTES UA: NEGATIVE
Nitrite: NEGATIVE
PROTEIN: NEGATIVE mg/dL
SPECIFIC GRAVITY, URINE: 1.004 — AB (ref 1.005–1.030)
SQUAMOUS EPITHELIAL / LPF: NONE SEEN
pH: 6 (ref 5.0–8.0)

## 2015-11-27 LAB — URINE DRUG SCREEN, QUALITATIVE (ARMC ONLY)
AMPHETAMINES, UR SCREEN: NOT DETECTED
BENZODIAZEPINE, UR SCRN: NOT DETECTED
Barbiturates, Ur Screen: NOT DETECTED
CANNABINOID 50 NG, UR ~~LOC~~: NOT DETECTED
Cocaine Metabolite,Ur ~~LOC~~: NOT DETECTED
MDMA (Ecstasy)Ur Screen: NOT DETECTED
Methadone Scn, Ur: NOT DETECTED
OPIATE, UR SCREEN: NOT DETECTED
Phencyclidine (PCP) Ur S: NOT DETECTED
Tricyclic, Ur Screen: NOT DETECTED

## 2015-11-27 LAB — COMPREHENSIVE METABOLIC PANEL
ALK PHOS: 61 U/L (ref 38–126)
ALT: 18 U/L (ref 17–63)
ANION GAP: 9 (ref 5–15)
AST: 19 U/L (ref 15–41)
Albumin: 3.9 g/dL (ref 3.5–5.0)
BUN: 15 mg/dL (ref 6–20)
CO2: 24 mmol/L (ref 22–32)
Calcium: 9.8 mg/dL (ref 8.9–10.3)
Chloride: 110 mmol/L (ref 101–111)
Creatinine, Ser: 0.96 mg/dL (ref 0.61–1.24)
GFR calc Af Amer: >60 mL/min (ref >60)
Glucose, Bld: 88 mg/dL (ref 65–99)
Potassium: 4.4 mmol/L (ref 3.5–5.1)
Sodium: 143 mmol/L (ref 135–145)
Total Bilirubin: 0.3 mg/dL (ref 0.3–1.2)
Total Protein: 7.1 g/dL (ref 6.5–8.1)

## 2015-11-27 LAB — CBC
HEMATOCRIT: 38.7 % — AB (ref 40.0–52.0)
Hemoglobin: 13.2 g/dL (ref 13.0–18.0)
MCH: 32.1 pg (ref 26.0–34.0)
MCHC: 34 g/dL (ref 32.0–36.0)
MCV: 94.5 fL (ref 80.0–100.0)
PLATELETS: 222 10*3/uL (ref 150–440)
RBC: 4.1 MIL/uL — ABNORMAL LOW (ref 4.40–5.90)
RDW: 14.5 % (ref 11.5–14.5)
WBC: 5.5 10*3/uL (ref 3.8–10.6)

## 2015-11-27 LAB — ETHANOL: Alcohol, Ethyl (B): 256 mg/dL — ABNORMAL HIGH (ref <5)

## 2015-11-27 MED ORDER — VITAMIN B-1 100 MG PO TABS: 100.0000 mg | ORAL_TABLET | Freq: Every day | ORAL | Status: DC

## 2015-11-27 MED ORDER — LORAZEPAM 2 MG PO TABS
0.0000 mg | ORAL_TABLET | Freq: Four times a day (QID) | ORAL | Status: DC
  Administered 2015-11-27: 1 mg via ORAL
  Filled 2015-11-27: qty 1

## 2015-11-27 MED ORDER — LORAZEPAM 2 MG/ML IJ SOLN: 0.0000 mg | Freq: Four times a day (QID) | INTRAMUSCULAR | Status: DC

## 2015-11-27 MED ORDER — THIAMINE HCL 100 MG/ML IJ SOLN: 100.0000 mg | Freq: Every day | INTRAMUSCULAR | Status: DC

## 2015-11-27 MED ORDER — LORAZEPAM 2 MG PO TABS: 0.0000 mg | ORAL_TABLET | Freq: Two times a day (BID) | ORAL | Status: DC

## 2015-11-27 MED ORDER — LORAZEPAM 2 MG/ML IJ SOLN: 0.0000 mg | Freq: Two times a day (BID) | INTRAMUSCULAR | Status: DC

## 2015-11-27 NOTE — ED Provider Notes (Signed)
Patient's been cleared by Dr. Weber Cooks for discharge. He currently is not suicidal.  Earleen Newport, MD 11/27/15 1556

## 2015-11-27 NOTE — ED Notes (Signed)
States lives with wife, his daughter and his wife brouight him here, drinks several days every 4 hours, has had vague SI thoughts, has had SI without a plan for past week, has had long time of etoh abuse

## 2015-11-27 NOTE — ED Notes (Signed)

## 2015-11-27 NOTE — ED Notes (Signed)
BEHAVIORAL HEALTH ROUNDING Patient sleeping: No. Patient alert and oriented: yes Behavior appropriate: Yes.  ; If no, describe:  Nutrition and fluids offered: Yes  Toileting and hygiene offered: Yes  Sitter present: no Law enforcement present: Yes  

## 2015-11-27 NOTE — ED Notes (Signed)
Pt here intoxicated stating he wants help to "quit drinking".  Pt advises he has been drunk for 3 days and was drinking bourbon this am.

## 2015-11-27 NOTE — ED Notes (Signed)
[name] from intake said pt to go to [facility], [name] left information and directions for family to take pt there

## 2015-11-27 NOTE — ED Notes (Signed)
Family states pt has been at [facility] for detox, last admission 3 days ago

## 2015-11-27 NOTE — ED Notes (Signed)
Family notified to come and get pt

## 2015-11-27 NOTE — ED Notes (Signed)
Patient assigned to appropriate care area. Patient oriented to unit/care area: Informed that, for their safety, care areas are designed for safety and monitored by security cameras at all times; and visiting hours explained to patient. Patient verbalizes understanding, and verbal contract for safety obtained. 

## 2015-11-27 NOTE — Progress Notes (Signed)
TTS has called [facility] and verified that they have a male detox bed available. Per request of ER MD, writer provided the pt. with information and instructions on how to access [facility] ([facility]).    11/27/2015 Con Memos, MS, [place], [facility] Therapeutic Triage Specialist
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
## 2015-11-27 NOTE — ED Notes (Signed)
Family states pt has been begging to die, they are going thru some financial issues

## 2015-11-27 NOTE — ED Notes (Signed)
Spoke with EDP Jimmye Norman regarding patient coming back in to the ED. MD aware that patient sent to [facility], however was sent back to the ED because [facility] does not accept Medicare. Patient has been seen and cleared by psych and deemed to be safely seen and evaluated on an outpatient basis. Patient and spouse informed that detox is not done here at this facility and that they would need to seek services on an outpatient basis. Patient never triaged - given copy of discharge summary as written by Weber Cooks, MD (given per direction of Jimmye Norman, MD). Patient left from ED lobby in care of his wife; reports that they will follow up tomorrow further further evaluation.

## 2015-11-27 NOTE — Discharge Instructions (Signed)
Alcohol Intoxication  Alcohol intoxication occurs when the amount of alcohol that a person has consumed impairs his or her ability to mentally and physically function. Alcohol directly impairs the normal chemical activity of the brain. Drinking large amounts of alcohol can lead to changes in mental function and behavior, and it can cause many physical effects that can be harmful.   Alcohol intoxication can range in severity from mild to very severe. Various factors can affect the level of intoxication that occurs, such as the person's age, gender, weight, frequency of alcohol consumption, and the presence of other medical conditions (such as diabetes, seizures, or heart conditions). Dangerous levels of alcohol intoxication may occur when people drink large amounts of alcohol in a short period (binge drinking). Alcohol can also be especially dangerous when combined with certain prescription medicines or "recreational" drugs.  SIGNS AND SYMPTOMS  Some common signs and symptoms of mild alcohol intoxication include:  · Loss of coordination.  · Changes in mood and behavior.  · Impaired judgment.  · Slurred speech.  As alcohol intoxication progresses to more severe levels, other signs and symptoms will appear. These may include:  · Vomiting.  · Confusion and impaired memory.  · Slowed breathing.  · Seizures.  · Loss of consciousness.  DIAGNOSIS   Your health care provider will take a medical history and perform a physical exam. You will be asked about the amount and type of alcohol you have consumed. Blood tests will be done to measure the concentration of alcohol in your blood. In many places, your blood alcohol level must be lower than 80 mg/dL (0.08%) to legally drive. However, many dangerous effects of alcohol can occur at much lower levels.   TREATMENT   People with alcohol intoxication often do not require treatment. Most of the effects of alcohol intoxication are temporary, and they go away as the alcohol naturally  leaves the body. Your health care provider will monitor your condition until you are stable enough to go home. Fluids are sometimes given through an IV access tube to help prevent dehydration.   HOME CARE INSTRUCTIONS  · Do not drive after drinking alcohol.  · Stay hydrated. Drink enough water and fluids to keep your urine clear or pale yellow. Avoid caffeine.    · Only take over-the-counter or prescription medicines as directed by your health care provider.    SEEK MEDICAL CARE IF:   · You have persistent vomiting.    · You do not feel better after a few days.  · You have frequent alcohol intoxication. Your health care provider can help determine if you should see a substance use treatment counselor.  SEEK IMMEDIATE MEDICAL CARE IF:   · You become shaky or tremble when you try to stop drinking.    · You shake uncontrollably (seizure).    · You throw up (vomit) blood. This may be bright red or may look like black coffee grounds.    · You have blood in your stool. This may be bright red or may appear as a black, tarry, bad smelling stool.    · You become lightheaded or faint.    MAKE SURE YOU:   · Understand these instructions.  · Will watch your condition.  · Will get help right away if you are not doing well or get worse.     This information is not intended to replace advice given to you by your health care provider. Make sure you discuss any questions you have with your health care provider.       Document Released: 09/16/2005 Document Revised: 08/09/2013 Document Reviewed: 05/12/2013  Elsevier Interactive Patient Education ©2016 [employer]    Alcohol Use Disorder  Alcohol use disorder is a mental disorder. It is not a one-time incident of heavy drinking. Alcohol use disorder is the excessive and uncontrollable use of alcohol over time that leads to problems with functioning in one or more areas of daily living. People with this disorder risk harming themselves and others when they drink to excess. Alcohol use  disorder also can cause other mental disorders, such as mood and anxiety disorders, and serious physical problems. People with alcohol use disorder often misuse other drugs.   Alcohol use disorder is common and widespread. Some people with this disorder drink alcohol to cope with or escape from negative life events. Others drink to relieve chronic pain or symptoms of mental illness. People with a family history of alcohol use disorder are at higher risk of losing control and using alcohol to excess.   Drinking too much alcohol can cause injury, accidents, and health problems. One drink can be too much when you are:  · Working.  · Pregnant or breastfeeding.  · Taking medicines. Ask your doctor.  · Driving or planning to drive.  SYMPTOMS   Signs and symptoms of alcohol use disorder may include the following:   · Consumption of alcohol in larger amounts or over a longer period of time than intended.  · Multiple unsuccessful attempts to cut down or control alcohol use.    · A great deal of time spent obtaining alcohol, using alcohol, or recovering from the effects of alcohol (hangover).  · A strong desire or urge to use alcohol (cravings).    · Continued use of alcohol despite problems at work, school, or home because of alcohol use.    · Continued use of alcohol despite problems in relationships because of alcohol use.  · Continued use of alcohol in situations when it is physically hazardous, such as driving a car.  · Continued use of alcohol despite awareness of a physical or psychological problem that is likely related to alcohol use. Physical problems related to alcohol use can involve the brain, heart, liver, stomach, and intestines. Psychological problems related to alcohol use include intoxication, depression, anxiety, psychosis, delirium, and dementia.    · The need for increased amounts of alcohol to achieve the same desired effect, or a decreased effect from the consumption of the same amount of alcohol  (tolerance).  · Withdrawal symptoms upon reducing or stopping alcohol use, or alcohol use to reduce or avoid withdrawal symptoms. Withdrawal symptoms include:  ¨ Racing heart.  ¨ Hand tremor.  ¨ Difficulty sleeping.  ¨ Nausea.  ¨ Vomiting.  ¨ Hallucinations.  ¨ Restlessness.  ¨ Seizures.  DIAGNOSIS  Alcohol use disorder is diagnosed through an assessment by your health care provider. Your health care provider may start by asking three or four questions to screen for excessive or problematic alcohol use. To confirm a diagnosis of alcohol use disorder, at least two symptoms must be present within a 12-month period. The severity of alcohol use disorder depends on the number of symptoms:  · Mild--two or three.  · Moderate--four or five.  · Severe--six or more.  Your health care provider may perform a physical exam or use results from lab tests to see if you have physical problems resulting from alcohol use. Your health care provider may refer you to a mental health professional for evaluation.  TREATMENT   Some people with alcohol use disorder are   able to reduce their alcohol use to low-risk levels. Some people with alcohol use disorder need to quit drinking alcohol. When necessary, mental health professionals with specialized training in substance use treatment can help. Your health care provider can help you decide how severe your alcohol use disorder is and what type of treatment you need. The following forms of treatment are available:   · Detoxification. Detoxification involves the use of prescription medicines to prevent alcohol withdrawal symptoms in the first week after quitting. This is important for people with a history of symptoms of withdrawal and for heavy drinkers who are likely to have withdrawal symptoms. Alcohol withdrawal can be dangerous and, in severe cases, cause death. Detoxification is usually provided in a hospital or in-patient substance use treatment facility.  · Counseling or talk therapy.  Talk therapy is provided by substance use treatment counselors. It addresses the reasons people use alcohol and ways to keep them from drinking again. The goals of talk therapy are to help people with alcohol use disorder find healthy activities and ways to cope with life stress, to identify and avoid triggers for alcohol use, and to handle cravings, which can cause relapse.  · Medicines. Different medicines can help treat alcohol use disorder through the following actions:    Decrease alcohol cravings.    Decrease the positive reward response felt from alcohol use.    Produce an uncomfortable physical reaction when alcohol is used (aversion therapy).  · Support groups. Support groups are run by people who have quit drinking. They provide emotional support, advice, and guidance.  These forms of treatment are often combined. Some people with alcohol use disorder benefit from intensive combination treatment provided by specialized substance use treatment centers. Both inpatient and outpatient treatment programs are available.     This information is not intended to replace advice given to you by your health care provider. Make sure you discuss any questions you have with your health care provider.     Document Released: 01/14/2005 Document Revised: 12/28/2014 Document Reviewed: 03/16/2013  Elsevier Interactive Patient Education ©2016 [employer]
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
## 2016-02-10 ENCOUNTER — Ambulatory Visit (INDEPENDENT_AMBULATORY_CARE_PROVIDER_SITE_OTHER): Payer: Medicare Other | Admitting: *Deleted

## 2016-02-10 ENCOUNTER — Telehealth: Payer: Self-pay | Admitting: Cardiology

## 2016-02-10 DIAGNOSIS — I442 Atrioventricular block, complete: Secondary | ICD-10-CM

## 2016-02-10 NOTE — Telephone Encounter (Signed)
Spoke with pt and reminded pt of remote transmission that is due today. Pt verbalized understanding.   

## 2016-02-12 NOTE — Progress Notes (Signed)
Remote pacemaker transmission.   

## 2016-02-23 ENCOUNTER — Encounter: Payer: Self-pay | Admitting: Cardiology

## 2016-02-23 LAB — CUP PACEART REMOTE DEVICE CHECK
Battery Impedance: 502 Ohm
Battery Voltage: 2.78 V
Brady Statistic AP VP Percent: 36 %
Brady Statistic AS VP Percent: 64 %
Brady Statistic AS VS Percent: 0 %
Implantable Lead Implant Date: 20070802
Implantable Lead Location: 753859
Implantable Lead Model: 5076
Lead Channel Impedance Value: 575 Ohm
Lead Channel Setting Pacing Pulse Width: 0.4 ms
Lead Channel Setting Sensing Sensitivity: 2.8 mV
MDC IDC LEAD IMPLANT DT: 20111027
MDC IDC LEAD LOCATION: 753860
MDC IDC MSMT BATTERY REMAINING LONGEVITY: 81 mo
MDC IDC MSMT LEADCHNL RA IMPEDANCE VALUE: 501 Ohm
MDC IDC SESS DTM: 20170220212506
MDC IDC SET LEADCHNL RA PACING AMPLITUDE: 2 V
MDC IDC SET LEADCHNL RV PACING AMPLITUDE: 2.5 V
MDC IDC STAT BRADY AP VS PERCENT: 0 %

## 2016-02-23 NOTE — Progress Notes (Signed)
Normal remote reviewed. MS episodes are mostly AT Pt declined [place]  [facility] 05/11/16

## 2016-05-11 ENCOUNTER — Ambulatory Visit (INDEPENDENT_AMBULATORY_CARE_PROVIDER_SITE_OTHER): Payer: Medicare Other | Admitting: *Deleted

## 2016-05-11 ENCOUNTER — Telehealth: Payer: Self-pay | Admitting: Cardiology

## 2016-05-11 DIAGNOSIS — I442 Atrioventricular block, complete: Secondary | ICD-10-CM | POA: Diagnosis not present

## 2016-05-11 NOTE — Telephone Encounter (Signed)
LMOVM reminding pt to send remote transmission.   

## 2016-05-15 ENCOUNTER — Encounter: Payer: Self-pay | Admitting: Cardiology

## 2016-05-15 NOTE — Progress Notes (Signed)
Remote pacemaker transmission.   

## 2016-05-26 ENCOUNTER — Telehealth: Payer: Self-pay | Admitting: Internal Medicine

## 2016-05-26 NOTE — Telephone Encounter (Signed)
New Message:   He wants to know if you received his transmission from 05-22-16?

## 2016-05-26 NOTE — Telephone Encounter (Signed)
Notified [name] that his transmission was received. No further questions, he is appreciative.

## 2016-05-29 LAB — CUP PACEART REMOTE DEVICE CHECK
Battery Remaining Longevity: 78 mo
Implantable Lead Implant Date: 20070802
Implantable Lead Location: 753859
Implantable Lead Location: 753860
Implantable Lead Model: 5076
Lead Channel Impedance Value: 503 Ohm
Lead Channel Impedance Value: 549 Ohm
Lead Channel Pacing Threshold Amplitude: 0.625 V
Lead Channel Sensing Intrinsic Amplitude: 2.8 mV
Lead Channel Setting Pacing Pulse Width: 0.4 ms
MDC IDC LEAD IMPLANT DT: 20111027
MDC IDC MSMT BATTERY IMPEDANCE: 526 Ohm
MDC IDC MSMT BATTERY VOLTAGE: 2.78 V
MDC IDC MSMT LEADCHNL RA PACING THRESHOLD PULSEWIDTH: 0.4 ms
MDC IDC MSMT LEADCHNL RV PACING THRESHOLD AMPLITUDE: 0.875 V
MDC IDC MSMT LEADCHNL RV PACING THRESHOLD PULSEWIDTH: 0.4 ms
MDC IDC SESS DTM: 20170525145421
MDC IDC SET LEADCHNL RA PACING AMPLITUDE: 2 V
MDC IDC SET LEADCHNL RV PACING AMPLITUDE: 2.5 V
MDC IDC SET LEADCHNL RV SENSING SENSITIVITY: 2.8 mV
MDC IDC STAT BRADY AP VP PERCENT: 49 %
MDC IDC STAT BRADY AP VS PERCENT: 0 %
MDC IDC STAT BRADY AS VP PERCENT: 51 %
MDC IDC STAT BRADY AS VS PERCENT: 0 %

## 2016-06-04 ENCOUNTER — Encounter: Payer: Self-pay | Admitting: Cardiology

## 2016-07-21 DEATH — deceased

## 2016-11-11 ENCOUNTER — Encounter: Payer: Medicare Other | Admitting: Nurse Practitioner
